# Patient Record
Sex: Male | Born: 1985 | Race: Black or African American | Hispanic: No | Marital: Single | Smoking: Former smoker
Health system: Southern US, Community
[De-identification: ages and names within clinical notes are randomized; demographics above are authoritative.]

## PROBLEM LIST (undated history)

## (undated) DIAGNOSIS — I214 Non-ST elevation (NSTEMI) myocardial infarction: Secondary | ICD-10-CM

## (undated) DIAGNOSIS — N289 Disorder of kidney and ureter, unspecified: Secondary | ICD-10-CM

## (undated) DIAGNOSIS — J45909 Unspecified asthma, uncomplicated: Secondary | ICD-10-CM

## (undated) DIAGNOSIS — N189 Chronic kidney disease, unspecified: Secondary | ICD-10-CM

## (undated) DIAGNOSIS — Z95810 Presence of automatic (implantable) cardiac defibrillator: Secondary | ICD-10-CM

## (undated) DIAGNOSIS — I10 Essential (primary) hypertension: Secondary | ICD-10-CM

## (undated) DIAGNOSIS — I480 Paroxysmal atrial fibrillation: Secondary | ICD-10-CM

## (undated) HISTORY — DX: Presence of automatic (implantable) cardiac defibrillator: Z95.810

## (undated) HISTORY — PX: NO PAST SURGERIES: SHX2092

## (undated) HISTORY — DX: Paroxysmal atrial fibrillation: I48.0

---

## 2017-03-13 ENCOUNTER — Inpatient Hospital Stay (HOSPITAL_COMMUNITY)
Admission: AD | Admit: 2017-03-13 | Discharge: 2017-03-17 | DRG: 280 | Disposition: A | Discharge status: Home / Self Care | Payer: Self-pay | Source: Other Acute Inpatient Hospital | Attending: Cardiovascular Disease | Admitting: Cardiovascular Disease

## 2017-03-13 ENCOUNTER — Encounter (HOSPITAL_COMMUNITY): Payer: Self-pay | Admitting: Student

## 2017-03-13 DIAGNOSIS — IMO0001 Reserved for inherently not codable concepts without codable children: Secondary | ICD-10-CM

## 2017-03-13 DIAGNOSIS — Z87891 Personal history of nicotine dependence: Secondary | ICD-10-CM

## 2017-03-13 DIAGNOSIS — I428 Other cardiomyopathies: Secondary | ICD-10-CM

## 2017-03-13 DIAGNOSIS — R0602 Shortness of breath: Secondary | ICD-10-CM

## 2017-03-13 DIAGNOSIS — I130 Hypertensive heart and chronic kidney disease with heart failure and stage 1 through stage 4 chronic kidney disease, or unspecified chronic kidney disease: Secondary | ICD-10-CM | POA: Diagnosis present

## 2017-03-13 DIAGNOSIS — I10 Essential (primary) hypertension: Secondary | ICD-10-CM | POA: Diagnosis present

## 2017-03-13 DIAGNOSIS — I5043 Acute on chronic combined systolic (congestive) and diastolic (congestive) heart failure: Secondary | ICD-10-CM | POA: Diagnosis present

## 2017-03-13 DIAGNOSIS — D72829 Elevated white blood cell count, unspecified: Secondary | ICD-10-CM | POA: Diagnosis present

## 2017-03-13 DIAGNOSIS — Z0389 Encounter for observation for other suspected diseases and conditions ruled out: Secondary | ICD-10-CM

## 2017-03-13 DIAGNOSIS — E785 Hyperlipidemia, unspecified: Secondary | ICD-10-CM | POA: Diagnosis present

## 2017-03-13 DIAGNOSIS — G4733 Obstructive sleep apnea (adult) (pediatric): Secondary | ICD-10-CM | POA: Diagnosis present

## 2017-03-13 DIAGNOSIS — Z6841 Body Mass Index (BMI) 40.0 and over, adult: Secondary | ICD-10-CM

## 2017-03-13 DIAGNOSIS — I493 Ventricular premature depolarization: Secondary | ICD-10-CM | POA: Diagnosis not present

## 2017-03-13 DIAGNOSIS — Z8249 Family history of ischemic heart disease and other diseases of the circulatory system: Secondary | ICD-10-CM

## 2017-03-13 DIAGNOSIS — I214 Non-ST elevation (NSTEMI) myocardial infarction: Principal | ICD-10-CM

## 2017-03-13 DIAGNOSIS — N182 Chronic kidney disease, stage 2 (mild): Secondary | ICD-10-CM | POA: Diagnosis present

## 2017-03-13 HISTORY — DX: Non-ST elevation (NSTEMI) myocardial infarction: I21.4

## 2017-03-13 HISTORY — DX: Chronic kidney disease, unspecified: N18.9

## 2017-03-13 HISTORY — DX: Essential (primary) hypertension: I10

## 2017-03-13 HISTORY — DX: Unspecified asthma, uncomplicated: J45.909

## 2017-03-13 HISTORY — DX: Disorder of kidney and ureter, unspecified: N28.9

## 2017-03-13 LAB — TROPONIN I: TROPONIN I: 12.74 ng/mL — AB (ref <0.03)

## 2017-03-13 LAB — HEMOGLOBIN A1C
HEMOGLOBIN A1C: 5.2 % (ref 4.8–5.6)
Mean Plasma Glucose: 102.54 mg/dL

## 2017-03-13 LAB — HEPARIN LEVEL (UNFRACTIONATED): Heparin Unfractionated: 0.6 IU/mL (ref 0.30–0.70)

## 2017-03-13 MED ORDER — ONDANSETRON HCL 4 MG/2ML IJ SOLN: 4.0000 mg | Freq: Four times a day (QID) | INTRAMUSCULAR | Status: DC | PRN

## 2017-03-13 MED ORDER — ACETAMINOPHEN 325 MG PO TABS
650.0000 mg | ORAL_TABLET | ORAL | Status: DC | PRN
  Administered 2017-03-14: 650 mg via ORAL
  Filled 2017-03-13: qty 2

## 2017-03-13 MED ORDER — SODIUM CHLORIDE 0.9 % IV SOLN
INTRAVENOUS | Status: DC
  Administered 2017-03-14: 07:00:00 via INTRAVENOUS

## 2017-03-13 MED ORDER — SODIUM CHLORIDE 0.9% FLUSH: 3.0000 mL | Freq: Two times a day (BID) | INTRAVENOUS | Status: DC

## 2017-03-13 MED ORDER — SODIUM CHLORIDE 0.9% FLUSH: 3.0000 mL | INTRAVENOUS | Status: DC | PRN

## 2017-03-13 MED ORDER — HEPARIN (PORCINE) IN NACL 100-0.45 UNIT/ML-% IJ SOLN
1800.0000 [IU]/h | INTRAMUSCULAR | Status: DC
  Administered 2017-03-13 – 2017-03-14 (×2): 1900 [IU]/h via INTRAVENOUS
  Filled 2017-03-13: qty 250

## 2017-03-13 MED ORDER — ASPIRIN 81 MG PO CHEW
81.0000 mg | CHEWABLE_TABLET | ORAL | Status: AC
  Administered 2017-03-14: 81 mg via ORAL
  Filled 2017-03-13: qty 1

## 2017-03-13 MED ORDER — METOPROLOL TARTRATE 12.5 MG HALF TABLET
12.5000 mg | ORAL_TABLET | Freq: Two times a day (BID) | ORAL | Status: DC
  Administered 2017-03-13: 12.5 mg via ORAL
  Filled 2017-03-13: qty 1

## 2017-03-13 MED ORDER — AMLODIPINE BESYLATE 10 MG PO TABS
10.0000 mg | ORAL_TABLET | Freq: Every day | ORAL | Status: DC
  Administered 2017-03-14: 10 mg via ORAL
  Filled 2017-03-13: qty 1

## 2017-03-13 MED ORDER — SODIUM CHLORIDE 0.9 % IV SOLN: 250.0000 mL | INTRAVENOUS | Status: DC | PRN

## 2017-03-13 MED ORDER — ASPIRIN EC 81 MG PO TBEC
81.0000 mg | DELAYED_RELEASE_TABLET | Freq: Every day | ORAL | Status: DC
  Administered 2017-03-15 – 2017-03-17 (×3): 81 mg via ORAL
  Filled 2017-03-13 (×3): qty 1

## 2017-03-13 MED ORDER — NITROGLYCERIN 0.4 MG SL SUBL: 0.4000 mg | SUBLINGUAL_TABLET | SUBLINGUAL | Status: DC | PRN

## 2017-03-13 MED ORDER — ATORVASTATIN CALCIUM 80 MG PO TABS
80.0000 mg | ORAL_TABLET | Freq: Every day | ORAL | Status: DC
  Administered 2017-03-14: 80 mg via ORAL
  Filled 2017-03-13: qty 1

## 2017-03-13 NOTE — Progress Notes (Signed)
ANTICOAGULATION CONSULT NOTE - Follow up Consult  Pharmacy Consult for Heparin Indication:  ACS/NSTEMI  No Known Allergies  Patient Measurements: Height: 5\' 9"  (175.3 cm) Weight: (!) 352 lb 6.4 oz (159.8 kg) (scale A) IBW/kg (Calculated) : 70.7 Heparin Dosing Weight: 110 kg  Vital Signs: Temp: 98.9 F (37.2 C) (08/29 2024) Temp Source: Oral (08/29 2024) BP: 162/97 (08/29 2024) Pulse Rate: 74 (08/29 2024)  Labs:  Recent Labs  03/13/17 1955 03/13/17 2143  HEPARINUNFRC  --  0.60  TROPONINI 12.74*  --     CrCl cannot be calculated (No order found.).   Medical History: Past Medical History:  Diagnosis Date  . Childhood asthma   . CKD (chronic kidney disease)    "left kidney weaker than right" (03/13/2017)  . Hypertension   . NSTEMI (non-ST elevated myocardial infarction) (HCC) 03/13/2017   [name] 03/13/2017  . Renal insufficiency    PTA medications: Amlodipine 10 mg po daily and Lisinopril 10 mg daily  PTA med list pending.   Medications:  Scheduled:  . [START ON 03/14/2017] amLODipine  10 mg Oral Daily  . [START ON 03/14/2017] aspirin  81 mg Oral Pre-Cath  . [START ON 03/14/2017] aspirin EC  81 mg Oral Daily  . [START ON 03/14/2017] atorvastatin  80 mg Oral q1800  . metoprolol tartrate  12.5 mg Oral BID  . sodium chloride flush  3 mL Intravenous Q12H    Assessment: 31 y.o obese male transferred from [facility] for an NSTEMI. Initial troponin 2.850 with repeat 9.020. PMH of HTN and renal insufficiency noted.  Heparin drip started at [facility] with 5000 unit IV bolus at 13:43 today and heparin drip at 12 units/kg/hr (1900 units/hr=19 ml/hr). RN reports heparin drip currently infusing at 19 ml/hr (1900 units/hr).  No bleeding reported.  --Heparin level came back therapeutic at 0.6 this evening, no bleeding noted--  Goal of Therapy:  Heparin level 0.3-0.7 units/ml Monitor platelets by anticoagulation protocol: Yes   Plan:  Continue IV Heparin drip at  1900 units/hr  Daily HL, CBC while on IV heparin drip.   Thank you for allowing [name] to participate in this patients care. Signe Colt, PharmD 03/13/2017 11:01 PM

## 2017-03-13 NOTE — Progress Notes (Signed)
ANTICOAGULATION CONSULT NOTE - Initial Consult  Pharmacy Consult for Heparin Indication:  ACS/NSTEMI  No Known Allergies  Patient Measurements: Height: 5\' 9"  (175.3 cm) Weight: (!) 352 lb 6.4 oz (159.8 kg) (scale A) IBW/kg (Calculated) : 70.7 Heparin Dosing Weight: 110 kg  Vital Signs: Temp: 98.9 F (37.2 C) (08/29 2024) Temp Source: Oral (08/29 2024) Pulse Rate: 74 (08/29 2024)  Labs: No results for input(s): HGB, HCT, PLT, APTT, LABPROT, INR, HEPARINUNFRC, HEPRLOWMOCWT, CREATININE, CKTOTAL, CKMB, TROPONINI in the last 72 hours.  CrCl cannot be calculated (No order found.).   Medical History: Past Medical History:  Diagnosis Date  . Hypertension   . Renal insufficiency    PTA medications: Amlodipine 10 mg po daily and Lisinopril 10 mg daily  PTA med list pending.   Medications:  Scheduled:  . [START ON 03/14/2017] amLODipine  10 mg Oral Daily  . [START ON 03/14/2017] aspirin  81 mg Oral Pre-Cath  . [START ON 03/14/2017] aspirin EC  81 mg Oral Daily  . [START ON 03/14/2017] atorvastatin  80 mg Oral q1800  . metoprolol tartrate  12.5 mg Oral BID  . sodium chloride flush  3 mL Intravenous Q12H    Assessment: 31 y.o obese male  transferred from [facility] for an NSTEMI. Initial troponin 2.850 with repeat 9.020. PMH of HTN and renal insufficiency noted.  Heparin drip started at  [facility] with 5000 unit IV bolus at 13:43 today and heparin drip at 12 units/kg/hr (1900 units/hr=19 ml/hr). RN reports heparin drip currently infusing at 19 ml/hr (1900 units/hr).  No bleeding reported.  I spoke with patient-confirmed he is not taking any anticoagulant PTA , except for ASA 81mg  daily (misses some days of ASA, though).  Educated patient about his IV heparin and to report any bleeding to RN.    Goal of Therapy:  Heparin level 0.3-0.7 units/ml Monitor platelets by anticoagulation protocol: Yes   Plan:  Continue IV Heparin drip 1900 units/hr  Heparin level  STAT Daily HL, CBC while on IV heparin drip.   Thank you for allowing pharmacy to be part of this patients care team. Noah Delaine, RPh Clinical Pharmacist Pager: [phone]-4P [phone] Main Pharmacy [phone] 03/13/2017,8:43 PM

## 2017-03-13 NOTE — Progress Notes (Addendum)
Patient has arrived from [facility]. Patient stable. No CP. Cardiology PA, Strader notfiied regarding patient's arrival.  Note placed by Westley Foots, RN

## 2017-03-13 NOTE — H&P (Signed)
History & Physical    Patient ID: Richard Cochran MRN: [id number], DOB/AGE: 08/17/1985   Admit date: 03/13/2017  Primary Physician: No primary care provider on file. Primary Cardiologist: New to [facility]   History of Present Illness    Richard Cochran is a 31 y.o. male with past medical history of HTN and renal insufficiency who presents to [facility] on 03/13/2017 as a transfer from [facility] for an NSTEMI.   The patient reports he developed sternal chest pressure along 0300 this morning which awoke him from sleep. He denies any associated dyspnea, nausea, vomiting, or diaphoresis. He initially thought the pain was secondary to gas and he took antiacids with no relief. He took his son to school then proceeded to the ED. While there, he was given SL NTG with complete resolution of this symptoms. He denies any recurrent symptoms since. No episodes of chest pain or dyspnea prior to today.   No prior history of CAD. No known HLD or Type 2 DM. No known family history of CAD. Reports smoking cigarettes in the past but quit in his early 20's. Reports occasional alcohol use but no more than 2 drinks per week. He denies any recreational drug use. 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   Initial troponin 2.850 with repeat 9.020. Other labs show Na+ 142, K+ 4.3, creatinine 1.30 (baseline 1.2 - 1.3). WBC 13.2, Hgb 14.7, and platelets 229. D-dimer 244 (normal < 230 per reference range). PT 11.8, INR 1.04.    Past Medical History    Past Medical History:  Diagnosis Date  . Hypertension   . Renal insufficiency     No past surgical history on file.   Allergies  Allergies not on file   Home Medications    Prior to Admission medications   Not on File    Family History    Family History  Problem Relation Age of Onset  . Hypertension Mother   . Hypertension Father     Social History    Social History   Social History  . Marital status: Single    Spouse name: N/A  . Number of  children: N/A  . Years of education: N/A   Occupational History  . Not on file.   Social History Main Topics  . Smoking status: Former Games developer  . Smokeless tobacco: Never Used  . Alcohol use 0.6 oz/week    1 Shots of liquor per week  . Drug use: No  . Sexual activity: Not on file   Other Topics Concern  . Not on file   Social History Narrative  . No narrative on file     Review of Systems    General:  No chills, fever, night sweats or weight changes.  Cardiovascular:  No dyspnea on exertion, edema, orthopnea, palpitations, paroxysmal nocturnal dyspnea. Positive for chest pain.  Dermatological: No rash, lesions/masses Respiratory: No cough, dyspnea Urologic: No hematuria, dysuria Abdominal:   No nausea, vomiting, diarrhea, bright red blood per rectum, melena, or hematemesis Neurologic:  No visual changes, wkns, changes in mental status. All other systems reviewed and are otherwise negative except as noted above.  Physical Exam    There were no vitals taken for this visit.  General: Well developed, obese African American male in no acute distress. Head: Normocephalic, atraumatic, sclera non-icteric, no xanthomas, nares are without discharge.  Neck: No carotid bruits. JVD not elevated.  Lungs: Respirations regular and unlabored, without wheezes or rales.  Heart: Regular rate and rhythm.  No S3 or S4.  No murmur, no rubs, or gallops appreciated. Abdomen: Soft, non-tender, non-distended with normoactive bowel sounds. No hepatomegaly. No rebound/guarding. No obvious abdominal masses. Msk:  Strength and tone appear normal for age. No joint deformities or effusions. Extremities: No clubbing or cyanosis. No lower extremity edema.  Distal pedal pulses are 2+ bilaterally. Neuro: Alert and oriented X 3. Moves all extremities spontaneously. No focal deficits noted. Psych:  Responds to questions appropriately with a normal affect. Skin: No rashes or lesions noted  Labs    Initial  troponin 2.850 with repeat 9.020. Na+ 142, K+ 4.3, creatinine 1.30 (baseline 1.2 - 1.3). WBC 13.2, Hgb 14.7, and platelets 229. D-dimer 244 (normal < 230 per reference range). PT 11.8, INR 1.04.  Radiology Studies    No results found.  EKG & Cardiac Imaging    EKG:  NSR, moderate LVH. No prior tracings available for comparison. - Personally Reviewed  ECHOCARDIOGRAM: None on File  Assessment & Plan    1. NSTEMI - the patient developed sternal chest pressure along 0300 this morning which awoke him from sleep. He denies any associated dyspnea, nausea, vomiting, or diaphoresis. Symptoms relieved with SL NTG.  - No prior history of CAD. No known HLD or Type 2 DM. No known family history of premature CAD.  - initial troponin 2.850 with repeat 9.020. Continue to cycle cardiac enzymes. Heparin per Pharmacy consult. Start BB and statin therapy. Check FLP and Hgb A1c.  - The patient understands that risks included but are not limited to stroke (1 in 1000), death (1 in 1000), kidney failure [usually temporary] (1 in 500), bleeding (1 in 200), allergic reaction [possibly serious] (1 in 200). Scheduled for cardiac catheterization tomorrow afternoon. Clear liquids for breakfast then NPO.   2. HTN - on Amlodipine 10mg  daily and Lisinopril 10mg  daily as an outpatient. Hold Lisinopril in anticipation of cardiac catheterization. Start BB therapy with current ACS.   3. Renal Insufficiency  - creatinine 1.30 (baseline 1.2 - 1.3). - follow closely following cardiac catheterization.   4. Elevated D-dimer - D-dimer 244 (normal < 230 per reference range). - if cath without acute findings, will need a CTA to rule-out PE.    Signed, Ellsworth Lennox, PA-C 03/13/2017, 8:07 PM Pager: [phone]  Personally seen and examined. Agree with above.  31 year old morbidly obese male, creat 1.3, trop 9, No ST changes on ECG with CP at 3am this AM, relieved with NTG. No fevers, no syncope, no SOB, no nausea.. No  early family history of MI. No drug use. Wife works with EP's at [employer].   RRR, obese, NAD, MAE, no rubs, Normal radial pulses, equal bilateral.   NSTEMI  - cath in AM  - IV heparin, Bb, amlodipine, holding ACE for cath and creat 1.3.   - If cath with no CAD, ?PE or myocarditis  - No distress currently  Donato Schultz, MD

## 2017-03-14 ENCOUNTER — Ambulatory Visit (HOSPITAL_COMMUNITY): Admit: 2017-03-14 | Payer: Self-pay | Admitting: Cardiology

## 2017-03-14 ENCOUNTER — Encounter (HOSPITAL_COMMUNITY): Payer: Self-pay | Admitting: Cardiology

## 2017-03-14 ENCOUNTER — Encounter (HOSPITAL_COMMUNITY)
Admission: AD | Disposition: A | Discharge status: Home / Self Care | Payer: Self-pay | Source: Other Acute Inpatient Hospital | Attending: Cardiovascular Disease

## 2017-03-14 ENCOUNTER — Inpatient Hospital Stay (HOSPITAL_COMMUNITY): Payer: Self-pay

## 2017-03-14 DIAGNOSIS — I428 Other cardiomyopathies: Secondary | ICD-10-CM

## 2017-03-14 DIAGNOSIS — I10 Essential (primary) hypertension: Secondary | ICD-10-CM

## 2017-03-14 DIAGNOSIS — R0683 Snoring: Secondary | ICD-10-CM

## 2017-03-14 HISTORY — PX: LEFT HEART CATH AND CORONARY ANGIOGRAPHY: CATH118249

## 2017-03-14 LAB — LIPID PANEL
Cholesterol: 175 mg/dL (ref 0–200)
HDL: 53 mg/dL (ref >40)
LDL CALC: 104 mg/dL — AB (ref 0–99)
Total CHOL/HDL Ratio: 3.3 RATIO
Triglycerides: 88 mg/dL (ref <150)
VLDL: 18 mg/dL (ref 0–40)

## 2017-03-14 LAB — ECHOCARDIOGRAM COMPLETE
Ao-asc: 34 cm
Area-P 1/2: 5.95 cm2
E decel time: 127 msec
FS: 6 % — AB (ref 28–44)
Height: 69 in
IVS/LV PW RATIO, ED: 0.71
LA ID, A-P, ES: 42 mm
LA diam index: 1.47 cm/m2
LA vol A4C: 92.6 ml
LA vol index: 34 mL/m2
LA vol: 97.3 mL
LDCA: 5.73 cm2
LEFT ATRIUM END SYS DIAM: 42 mm
LVOTD: 27 mm
MV Dec: 127
MV pk E vel: 57.5 m/s
MVPKAVEL: 28.2 m/s
P 1/2 time: 37 ms
PW: 20.8 mm — AB (ref 0.6–1.1)
RV TAPSE: 25 mm
Weight: 5577.6 oz

## 2017-03-14 LAB — CBC
HCT: 45.5 % (ref 39.0–52.0)
HEMOGLOBIN: 14.4 g/dL (ref 13.0–17.0)
MCH: 26.8 pg (ref 26.0–34.0)
MCHC: 31.6 g/dL (ref 30.0–36.0)
MCV: 84.7 fL (ref 78.0–100.0)
Platelets: 195 10*3/uL (ref 150–400)
RBC: 5.37 MIL/uL (ref 4.22–5.81)
RDW: 14 % (ref 11.5–15.5)
WBC: 13.6 10*3/uL — ABNORMAL HIGH (ref 4.0–10.5)

## 2017-03-14 LAB — BASIC METABOLIC PANEL
Anion gap: 10 (ref 5–15)
BUN: 11 mg/dL (ref 6–20)
CHLORIDE: 108 mmol/L (ref 101–111)
CO2: 22 mmol/L (ref 22–32)
CREATININE: 1.25 mg/dL — AB (ref 0.61–1.24)
Calcium: 8.9 mg/dL (ref 8.9–10.3)
GFR calc Af Amer: >60 mL/min (ref >60)
GFR calc non Af Amer: >60 mL/min (ref >60)
Glucose, Bld: 93 mg/dL (ref 65–99)
Potassium: 4 mmol/L (ref 3.5–5.1)
SODIUM: 140 mmol/L (ref 135–145)

## 2017-03-14 LAB — PROTIME-INR
INR: 1.05
PROTHROMBIN TIME: 13.6 s (ref 11.4–15.2)

## 2017-03-14 LAB — TROPONIN I
TROPONIN I: 14.77 ng/mL — AB (ref <0.03)
TROPONIN I: 8.92 ng/mL — AB (ref <0.03)

## 2017-03-14 LAB — HEPARIN LEVEL (UNFRACTIONATED)
HEPARIN UNFRACTIONATED: 0.73 [IU]/mL — AB (ref 0.30–0.70)
Heparin Unfractionated: 1.01 IU/mL — ABNORMAL HIGH (ref 0.30–0.70)

## 2017-03-14 SURGERY — LEFT HEART CATH AND CORONARY ANGIOGRAPHY
Anesthesia: LOCAL

## 2017-03-14 MED ORDER — LIDOCAINE HCL (PF) 1 % IJ SOLN
INTRAMUSCULAR | Status: DC | PRN
  Administered 2017-03-14: 2 mL

## 2017-03-14 MED ORDER — HEPARIN SODIUM (PORCINE) 5000 UNIT/ML IJ SOLN: 5000.0000 [IU] | Freq: Three times a day (TID) | INTRAMUSCULAR | Status: DC

## 2017-03-14 MED ORDER — IOPAMIDOL (ISOVUE-370) INJECTION 76%
INTRAVENOUS | Status: AC
  Filled 2017-03-14: qty 100

## 2017-03-14 MED ORDER — CARVEDILOL 6.25 MG PO TABS
6.2500 mg | ORAL_TABLET | Freq: Two times a day (BID) | ORAL | Status: DC
  Administered 2017-03-14 (×2): 6.25 mg via ORAL
  Filled 2017-03-14 (×2): qty 1

## 2017-03-14 MED ORDER — SODIUM CHLORIDE 0.9% FLUSH
3.0000 mL | INTRAVENOUS | Status: DC | PRN
  Administered 2017-03-15: 3 mL via INTRAVENOUS
  Filled 2017-03-14: qty 3

## 2017-03-14 MED ORDER — IOPAMIDOL (ISOVUE-370) INJECTION 76%
INTRAVENOUS | Status: DC | PRN
  Administered 2017-03-14: 110 mL via INTRA_ARTERIAL

## 2017-03-14 MED ORDER — SODIUM CHLORIDE 0.9 % IV SOLN: 250.0000 mL | INTRAVENOUS | Status: DC | PRN

## 2017-03-14 MED ORDER — SPIRONOLACTONE 25 MG PO TABS
12.5000 mg | ORAL_TABLET | Freq: Two times a day (BID) | ORAL | Status: DC
  Administered 2017-03-14 – 2017-03-16 (×5): 12.5 mg via ORAL
  Filled 2017-03-14 (×5): qty 1

## 2017-03-14 MED ORDER — FENTANYL CITRATE (PF) 100 MCG/2ML IJ SOLN
INTRAMUSCULAR | Status: DC | PRN
  Administered 2017-03-14 (×2): 25 ug via INTRAVENOUS

## 2017-03-14 MED ORDER — FENTANYL CITRATE (PF) 100 MCG/2ML IJ SOLN
INTRAMUSCULAR | Status: AC
  Filled 2017-03-14: qty 2

## 2017-03-14 MED ORDER — LISINOPRIL 5 MG PO TABS
5.0000 mg | ORAL_TABLET | Freq: Every day | ORAL | Status: DC
  Administered 2017-03-14: 5 mg via ORAL
  Filled 2017-03-14: qty 1

## 2017-03-14 MED ORDER — SODIUM CHLORIDE 0.9% FLUSH
3.0000 mL | Freq: Two times a day (BID) | INTRAVENOUS | Status: DC
  Administered 2017-03-14 – 2017-03-17 (×4): 3 mL via INTRAVENOUS

## 2017-03-14 MED ORDER — HEPARIN SODIUM (PORCINE) 1000 UNIT/ML IJ SOLN
INTRAMUSCULAR | Status: AC
  Filled 2017-03-14: qty 1

## 2017-03-14 MED ORDER — AMLODIPINE BESYLATE 5 MG PO TABS: 5.0000 mg | ORAL_TABLET | Freq: Every day | ORAL | Status: DC

## 2017-03-14 MED ORDER — MIDAZOLAM HCL 2 MG/2ML IJ SOLN
INTRAMUSCULAR | Status: DC | PRN
  Administered 2017-03-14 (×2): 1 mg via INTRAVENOUS

## 2017-03-14 MED ORDER — FUROSEMIDE 10 MG/ML IJ SOLN
40.0000 mg | Freq: Two times a day (BID) | INTRAMUSCULAR | Status: DC
  Administered 2017-03-14 – 2017-03-15 (×3): 40 mg via INTRAVENOUS
  Filled 2017-03-14 (×3): qty 4

## 2017-03-14 MED ORDER — HEPARIN SODIUM (PORCINE) 1000 UNIT/ML IJ SOLN
INTRAMUSCULAR | Status: DC | PRN
  Administered 2017-03-14: 6000 [IU] via INTRAVENOUS

## 2017-03-14 MED ORDER — LIDOCAINE HCL (PF) 1 % IJ SOLN
INTRAMUSCULAR | Status: AC
  Filled 2017-03-14: qty 30

## 2017-03-14 MED ORDER — VERAPAMIL HCL 2.5 MG/ML IV SOLN
INTRAVENOUS | Status: DC | PRN
  Administered 2017-03-14: 10 mL via INTRA_ARTERIAL

## 2017-03-14 MED ORDER — HEPARIN (PORCINE) IN NACL 2-0.9 UNIT/ML-% IJ SOLN
INTRAMUSCULAR | Status: AC | PRN
  Administered 2017-03-14: 1000 mL

## 2017-03-14 MED ORDER — MIDAZOLAM HCL 2 MG/2ML IJ SOLN
INTRAMUSCULAR | Status: AC
  Filled 2017-03-14: qty 2

## 2017-03-14 MED ORDER — VERAPAMIL HCL 2.5 MG/ML IV SOLN
INTRAVENOUS | Status: AC
  Filled 2017-03-14: qty 2

## 2017-03-14 MED ORDER — HEPARIN (PORCINE) IN NACL 100-0.45 UNIT/ML-% IJ SOLN
1600.0000 [IU]/h | INTRAMUSCULAR | Status: DC
  Administered 2017-03-14 – 2017-03-16 (×2): 1800 [IU]/h via INTRAVENOUS
  Filled 2017-03-14 (×3): qty 250

## 2017-03-14 MED ORDER — HEPARIN (PORCINE) IN NACL 2-0.9 UNIT/ML-% IJ SOLN
INTRAMUSCULAR | Status: AC
  Filled 2017-03-14: qty 1000

## 2017-03-14 SURGICAL SUPPLY — 15 items
CATH 5FR JL3.5 JR4 ANG PIG MP (CATHETERS) ×2 IMPLANT
CATH LAUNCHER 5F JL3 (CATHETERS) ×1 IMPLANT
CATH LAUNCHER 5F RADL (CATHETERS) ×1 IMPLANT
CATH OPTITORQUE JACKY 4.0 5F (CATHETERS) ×2 IMPLANT
CATHETER LAUNCHER 5F JL3 (CATHETERS) ×2
CATHETER LAUNCHER 5F RADL (CATHETERS) ×2
DEVICE RAD COMP TR BAND LRG (VASCULAR PRODUCTS) ×2 IMPLANT
GLIDESHEATH SLEND SS 6F .021 (SHEATH) ×2 IMPLANT
GUIDEWIRE INQWIRE 1.5J.035X260 (WIRE) ×1 IMPLANT
INQWIRE 1.5J .035X260CM (WIRE) ×2
KIT HEART LEFT (KITS) ×2 IMPLANT
PACK CARDIAC CATHETERIZATION (CUSTOM PROCEDURE TRAY) ×2 IMPLANT
SYR MEDRAD MARK V 150ML (SYRINGE) ×2 IMPLANT
TRANSDUCER W/STOPCOCK (MISCELLANEOUS) ×2 IMPLANT
TUBING CIL FLEX 10 FLL-RA (TUBING) ×2 IMPLANT

## 2017-03-14 NOTE — Progress Notes (Signed)
ANTICOAGULATION CONSULT NOTE - Follow Up Consult  Pharmacy Consult for heparin Indication: NSTEMI  Labs:  Recent Labs  03/13/17 1955 03/13/17 2143 03/14/17 0214  HEPARINUNFRC  --  0.60 0.73*  TROPONINI 12.74*  --   --     Assessment: 31yo male now slightly above goal on heparin after one level at goal.  Goal of Therapy:  Heparin level 0.3-0.7 units/ml   Plan:  Will decrease heparin gtt slightly to 1800 units/hr and check level with next lab draw.  Vernard Gambles, PharmD, BCPS  03/14/2017,3:07 AM

## 2017-03-14 NOTE — Progress Notes (Addendum)
Progress Note  Patient Name: Richard Cochran Date of Encounter: 03/14/2017  Primary Cardiologist: new  Subjective   No chest pain or SOB; back from cath lab  Inpatient Medications    Scheduled Meds: . amLODipine  10 mg Oral Daily  . aspirin EC  81 mg Oral Daily  . atorvastatin  80 mg Oral q1800  . carvedilol  6.25 mg Oral BID WC  . furosemide  40 mg Intravenous BID  . heparin  5,000 Units Subcutaneous Q8H  . lisinopril  5 mg Oral Daily  . sodium chloride flush  3 mL Intravenous Q12H   Continuous Infusions: . sodium chloride     PRN Meds: sodium chloride, acetaminophen, ondansetron (ZOFRAN) IV, sodium chloride flush   Vital Signs    Vitals:   03/14/17 1230 03/14/17 1300 03/14/17 1330 03/14/17 1400  BP: (!) 145/93 (!) 151/88 (!) 157/100 (!) 141/79  Pulse: 75 76 90 99  Resp:      Temp:    98.7 F (37.1 C)  TempSrc:    Oral  SpO2:    97%  Weight:      Height:        Intake/Output Summary (Last 24 hours) at 03/14/17 1416 Last data filed at 03/14/17 1313  Gross per 24 hour  Intake              360 ml  Output             2945 ml  Net            -2585 ml    I/O since admission:   [facility] Weights   03/13/17 2024 03/14/17 0541  Weight: (!) 352 lb 6.4 oz (159.8 kg) (!) 348 lb 9.6 oz (158.1 kg)    Telemetry    Sinus rhythm in 70 - 80s - Personally Reviewed  ECG    ECG (independently read by me):  Physical Exam   BP (!) 141/79 (BP Location: Left Arm)   Pulse 99   Temp 98.7 F (37.1 C) (Oral)   Resp 19   Ht 5\' 9"  (1.753 m)   Wt (!) 348 lb 9.6 oz (158.1 kg)   SpO2 97%   BMI 51.48 kg/m  General: Alert, oriented, no distress; obese  Skin: normal turgor, no rashes, warm and dry HEENT: Normocephalic, atraumatic. Pupils equal round and reactive to light; sclera anicteric; extraocular muscles intact;  Nose without nasal septal hypertrophy Mouth/Parynx benign; Mallinpatti scale 3 Neck:  Thick neck; No JVD, no carotid bruits; normal carotid  upstroke Lungs: clear to ausculatation and percussion; no wheezing or rales Chest wall: without tenderness to palpitation Heart: PMI not displaced, RRR, s1 s2 normal, 1/6 systolic murmur, no diastolic murmur, no rubs, gallops, thrills, or heaves Abdomen: soft, nontender; no hepatosplenomehaly, BS+; abdominal aorta nontender and not dilated by palpation. Back: no CVA tenderness Pulses 2+; R radial site stable Musculoskeletal: full range of motion, normal strength, no joint deformities Extremities: no clubbing cyanosis or edema, Homan's sign negative  Neurologic: grossly nonfocal; Cranial nerves grossly wnl Psychologic: Normal mood and affect   Labs    Chemistry Recent Labs Lab 03/14/17 0734  NA 140  K 4.0  CL 108  CO2 22  GLUCOSE 93  BUN 11  CREATININE 1.25*  CALCIUM 8.9  GFRNONAA >60  GFRAA >60  ANIONGAP 10     Hematology Recent Labs Lab 03/14/17 0734  WBC 13.6*  RBC 5.37  HGB 14.4  HCT 45.5  MCV 84.7  MCH  26.8  MCHC 31.6  RDW 14.0  PLT 195    Cardiac Enzymes Recent Labs Lab 03/13/17 1955 03/14/17 0214 03/14/17 0734  TROPONINI 12.74* 14.77* 8.92*   No results for input(s): TROPIPOC in the last 168 hours.   BNPNo results for input(s): BNP, PROBNP in the last 168 hours.   DDimer No results for input(s): DDIMER in the last 168 hours.   Lipid Panel     Component Value Date/Time   CHOL 175 03/14/2017 0214   TRIG 88 03/14/2017 0214   HDL 53 03/14/2017 0214   CHOLHDL 3.3 03/14/2017 0214   VLDL 18 03/14/2017 0214   LDLCALC 104 (H) 03/14/2017 0214    Radiology    No results found.  Cardiac Studies   Cardiac Cath Conclusion     There is severe left ventricular systolic dysfunction.  LV end diastolic pressure is moderately elevated.  The left ventricular ejection fraction is less than 25% by visual estimate.   1. Normal coronary anatomy 2. Marked LV enlargement with severe global hypokinesis and overall EF 15-20%. 3. Moderately elevated  LVEDP  Plan: aggressive therapy for CHF.     Patient Profile     31 y.o. male who was awakened with new onset chest pain which resolved with NTG sl, positive troponins, sent from [facility] for further evaluation.   Assessment & Plan    1.   NICM: EF 15 - 20% at cath with increased LVEDP; normal coronaries.  No recent change in exercise tolerance. No SOB.  Awakened from sleep 8/29 am with new chest pain. No recurrent chest pain since resolution. ? Myocarditis or other process;  Schedule for echo. Will plan to schedule for CT or cardiac MRI. Pk troponin 14.77.  With markedly decreased EF will resume heparin initially 8 hr post cath.   2. HTN: will treat with coreg,  lisinopril, decrease amlodipine and start spironolactone 12.5 mg bid, and titrate as BP allows.  3. Morbid obesity  4. Evaluate for OSA with h/o snoring, body habitus and awakening with chest pain. Will schedule for sleep study as outpatient.    Signed, Lennette Bihari, MD, [facility] 03/14/2017, 2:16 PM

## 2017-03-14 NOTE — Progress Notes (Signed)
I offered bath to pt and pt stated that he did it earlier this morning

## 2017-03-14 NOTE — Progress Notes (Signed)
ANTICOAGULATION CONSULT NOTE - Follow up Consult  Pharmacy Consult for Heparin Indication:  ACS/NSTEMI  No Known Allergies  Patient Measurements: Height: 5\' 9"  (175.3 cm) Weight: (!) 348 lb 9.6 oz (158.1 kg) IBW/kg (Calculated) : 70.7 Heparin Dosing Weight: 110 kg  Vital Signs: Temp: 98.7 F (37.1 C) (08/30 1400) Temp Source: Oral (08/30 1400) BP: 141/79 (08/30 1400) Pulse Rate: 99 (08/30 1400)  Labs:  Recent Labs  03/13/17 1955 03/13/17 2143 03/14/17 0214 03/14/17 0734  HGB  --   --   --  14.4  HCT  --   --   --  45.5  PLT  --   --   --  195  LABPROT  --   --   --  13.6  INR  --   --   --  1.05  HEPARINUNFRC  --  0.60 0.73* 1.01*  CREATININE  --   --   --  1.25*  TROPONINI 12.74*  --  14.77* 8.92*    Estimated Creatinine Clearance: 126.8 mL/min (A) (by C-G formula based on SCr of 1.25 mg/dL (H)).   Medical History: Past Medical History:  Diagnosis Date  . Childhood asthma   . CKD (chronic kidney disease)    "left kidney weaker than right" (03/13/2017)  . Hypertension   . NSTEMI (non-ST elevated myocardial infarction) (HCC) 03/13/2017   [name] 03/13/2017  . Renal insufficiency    PTA medications: Amlodipine 10 mg po daily and Lisinopril 10 mg daily  PTA med list pending.   Medications:  Scheduled:  . [START ON 03/15/2017] amLODipine  5 mg Oral Daily  . aspirin EC  81 mg Oral Daily  . atorvastatin  80 mg Oral q1800  . carvedilol  6.25 mg Oral BID WC  . furosemide  40 mg Intravenous BID  . heparin  5,000 Units Subcutaneous Q8H  . lisinopril  5 mg Oral Daily  . sodium chloride flush  3 mL Intravenous Q12H  . spironolactone  12.5 mg Oral BID    Assessment: 31 y.o obese male transferred from [facility] for an NSTEMI. Initial troponin 2.850 with repeat 9.020. PMH of HTN and renal insufficiency noted.  Heparin drip started at [facility]. Heparin was held for L. heart cath.  Heparin to be restarted 8 hours post TR band removal. Per RN, band was  removed at 1445.  Goal of Therapy:  Heparin level 0.3-0.7 units/ml Monitor platelets by anticoagulation protocol: Yes   Plan:  Restart IV Heparin drip at 1800 units/hr (starting at 2300) Follow up morning level Daily HL, CBC while on IV heparin drip.   Thank you for allowing [name] to participate in this patients care. Signe Colt, PharmD 03/14/2017 3:05 PM

## 2017-03-14 NOTE — Interval H&P Note (Signed)
History and Physical Interval Note:  03/14/2017 10:15 AM  Richard Cochran  has presented today for surgery, with the diagnosis of cp  The various methods of treatment have been discussed with the patient and family. After consideration of risks, benefits and other options for treatment, the patient has consented to  Procedure(s): LEFT HEART CATH AND CORONARY ANGIOGRAPHY (N/A) as a surgical intervention .  The patient's history has been reviewed, patient examined, no change in status, stable for surgery.  I have reviewed the patient's chart and labs.  Questions were answered to the patient's satisfaction.   Cath Lab Visit (complete for each Cath Lab visit)  Clinical Evaluation Leading to the Procedure:   ACS: Yes.    Non-ACS:    Anginal Classification: CCS IV  Anti-ischemic medical therapy: No Therapy  Non-Invasive Test Results: No non-invasive testing performed  Prior CABG: No previous CABG       Theron Arista [facility] 03/14/2017 10:15 AM

## 2017-03-14 NOTE — Progress Notes (Signed)
  Echocardiogram 2D Echocardiogram has been performed.  Amenah Tucci G Eniola Cerullo 03/14/2017, 1:26 PM

## 2017-03-14 NOTE — Progress Notes (Signed)
Lab reported Troponin of 12.74. Heparin drip infusing. Pt asymptomatic. MD paged. Will continue to monitor.

## 2017-03-15 ENCOUNTER — Other Ambulatory Visit: Payer: Self-pay | Admitting: *Deleted

## 2017-03-15 ENCOUNTER — Inpatient Hospital Stay (HOSPITAL_COMMUNITY): Payer: Self-pay

## 2017-03-15 DIAGNOSIS — I5043 Acute on chronic combined systolic (congestive) and diastolic (congestive) heart failure: Secondary | ICD-10-CM

## 2017-03-15 DIAGNOSIS — I10 Essential (primary) hypertension: Secondary | ICD-10-CM

## 2017-03-15 DIAGNOSIS — I428 Other cardiomyopathies: Secondary | ICD-10-CM

## 2017-03-15 DIAGNOSIS — D72829 Elevated white blood cell count, unspecified: Secondary | ICD-10-CM | POA: Diagnosis present

## 2017-03-15 DIAGNOSIS — E785 Hyperlipidemia, unspecified: Secondary | ICD-10-CM | POA: Diagnosis present

## 2017-03-15 DIAGNOSIS — I214 Non-ST elevation (NSTEMI) myocardial infarction: Secondary | ICD-10-CM

## 2017-03-15 DIAGNOSIS — N182 Chronic kidney disease, stage 2 (mild): Secondary | ICD-10-CM | POA: Diagnosis present

## 2017-03-15 DIAGNOSIS — R0681 Apnea, not elsewhere classified: Secondary | ICD-10-CM

## 2017-03-15 LAB — CBC
HEMATOCRIT: 47.1 % (ref 39.0–52.0)
HEMOGLOBIN: 15.2 g/dL (ref 13.0–17.0)
MCH: 27.2 pg (ref 26.0–34.0)
MCHC: 32.3 g/dL (ref 30.0–36.0)
MCV: 84.4 fL (ref 78.0–100.0)
Platelets: 210 10*3/uL (ref 150–400)
RBC: 5.58 MIL/uL (ref 4.22–5.81)
RDW: 14 % (ref 11.5–15.5)
WBC: 15 10*3/uL — ABNORMAL HIGH (ref 4.0–10.5)

## 2017-03-15 LAB — BASIC METABOLIC PANEL
Anion gap: 11 (ref 5–15)
BUN: 14 mg/dL (ref 6–20)
CALCIUM: 9.4 mg/dL (ref 8.9–10.3)
CO2: 24 mmol/L (ref 22–32)
Chloride: 103 mmol/L (ref 101–111)
Creatinine, Ser: 1.45 mg/dL — ABNORMAL HIGH (ref 0.61–1.24)
GFR calc Af Amer: >60 mL/min (ref >60)
GLUCOSE: 103 mg/dL — AB (ref 65–99)
POTASSIUM: 3.7 mmol/L (ref 3.5–5.1)
Sodium: 138 mmol/L (ref 135–145)

## 2017-03-15 LAB — HEPARIN LEVEL (UNFRACTIONATED): Heparin Unfractionated: 0.52 IU/mL (ref 0.30–0.70)

## 2017-03-15 LAB — HIV ANTIBODY (ROUTINE TESTING W REFLEX): HIV Screen 4th Generation wRfx: NONREACTIVE

## 2017-03-15 LAB — TSH: TSH: 2.034 u[IU]/mL (ref 0.350–4.500)

## 2017-03-15 MED ORDER — CARVEDILOL 6.25 MG PO TABS
6.2500 mg | ORAL_TABLET | Freq: Two times a day (BID) | ORAL | Status: DC
  Administered 2017-03-15 – 2017-03-16 (×3): 6.25 mg via ORAL
  Filled 2017-03-15 (×3): qty 1

## 2017-03-15 MED ORDER — TECHNETIUM TO 99M ALBUMIN AGGREGATED
4.0000 | Freq: Once | INTRAVENOUS | Status: AC | PRN
  Administered 2017-03-15: 4 via INTRAVENOUS

## 2017-03-15 MED ORDER — ATORVASTATIN CALCIUM 20 MG PO TABS
20.0000 mg | ORAL_TABLET | Freq: Every day | ORAL | Status: DC
  Administered 2017-03-15 – 2017-03-17 (×3): 20 mg via ORAL
  Filled 2017-03-15 (×3): qty 1

## 2017-03-15 MED ORDER — LOSARTAN POTASSIUM 25 MG PO TABS
25.0000 mg | ORAL_TABLET | Freq: Every day | ORAL | Status: DC
  Administered 2017-03-15 – 2017-03-16 (×2): 25 mg via ORAL
  Filled 2017-03-15 (×2): qty 1

## 2017-03-15 MED ORDER — LIVING BETTER WITH HEART FAILURE BOOK
Freq: Once | Status: AC
  Administered 2017-03-15: 09:00:00

## 2017-03-15 MED ORDER — TECHNETIUM TC 99M DIETHYLENETRIAME-PENTAACETIC ACID
30.0000 | Freq: Once | INTRAVENOUS | Status: AC | PRN
  Administered 2017-03-15: 30 via INTRAVENOUS

## 2017-03-15 NOTE — Progress Notes (Signed)
Continuous pulse ox ordered by unit Diplomatic Services operational officer. RN aware and she will placed patient on when it arrives to floor. RT discussed with patient regarding the machine and purpose for use.

## 2017-03-15 NOTE — Progress Notes (Signed)
   03/15/17 1000  Clinical Encounter Type  Visited With Patient  Visit Type Follow-up  Referral From Family  Consult/Referral To None  Spiritual Encounters  Spiritual Needs Other (Comment)  Stress Factors  Patient Stress Factors Other (Comment)  Family Stress Factors None identified  Advance Directives (For Healthcare)  Does Patient Have a Medical Advance Directive? No (Reviewed POA with patient)  Would patient like information on creating a medical advance directive? (The patient has an advanced directive filled out and is awai)  Mental Health Advance Directives  Does Patient Have a Mental Health Advance Directive? No    Visited with Richard Cochran who was alert and responsive.  The customer has a concern about what time the advanced directives can be completed.  Chaplain indicated to patient that prior to 2:30 is the best time.  Patient is waiting on the family to arrive so that spiritual care can be notified and notary will be available for witnesses.

## 2017-03-15 NOTE — Progress Notes (Signed)
Progress Note  Patient Name: Richard Cochran Date of Encounter: 03/15/2017  Primary Cardiologist: New to Dr. [name] this admission  Subjective   Feeling much better, wants to know when he can go home. Denies any further CP or SOB. No LEE. His girlfriend strongly feels he has OSA - she videotaped him overnight and he had evidence of apnea.  Inpatient Medications    Scheduled Meds: . amLODipine  5 mg Oral Daily  . aspirin EC  81 mg Oral Daily  . atorvastatin  80 mg Oral q1800  . carvedilol  6.25 mg Oral BID WC  . furosemide  40 mg Intravenous BID  . lisinopril  5 mg Oral Daily  . sodium chloride flush  3 mL Intravenous Q12H  . spironolactone  12.5 mg Oral BID   Continuous Infusions: . sodium chloride    . heparin 1,800 Units/hr (03/14/17 2317)   PRN Meds: sodium chloride, acetaminophen, ondansetron (ZOFRAN) IV, sodium chloride flush   Vital Signs    Vitals:   03/14/17 1400 03/14/17 1944 03/15/17 0019 03/15/17 0605  BP: (!) 141/79 136/79 114/70 133/73  Pulse: 99 97 95 85  Resp:  18 18 18   Temp: 98.7 F (37.1 C) 98.2 F (36.8 C) 98.4 F (36.9 C) 98 F (36.7 C)  TempSrc: Oral Oral Oral Oral  SpO2: 97% 98% 97% 97%  Weight:    (!) 340 lb 4.8 oz (154.4 kg)  Height:        Intake/Output Summary (Last 24 hours) at 03/15/17 1610 Last data filed at 03/15/17 9604  Gross per 24 hour  Intake            626.9 ml  Output             3270 ml  Net          -2643.1 ml   Filed Weights   03/13/17 2024 03/14/17 0541 03/15/17 0605  Weight: (!) 352 lb 6.4 oz (159.8 kg) (!) 348 lb 9.6 oz (158.1 kg) (!) 340 lb 4.8 oz (154.4 kg)    Telemetry    NSR/occ sinus tach- Personally Reviewed  Physical Exam   GEN: No acute distress, morbidly obese smiling AAM HEENT: Normocephalic, atraumatic, sclera non-icteric. Neck: No JVD or bruits. Cardiac: RRR no murmurs, rubs, or gallops.  Radials/DP/PT 1+ and equal bilaterally.  Respiratory: Diminished BS left base, no rales or rhonchi.  Breathing is unlabored. GI: Soft, nontender, non-distended, BS +x 4. MS: no deformity. Extremities: No clubbing or cyanosis. No edema. Distal pedal pulses are 2+ and equal bilaterally. Neuro:  AAOx3. Follows commands. Psych:  Responds to questions appropriately with a normal affect.  Labs    Chemistry Recent Labs Lab 03/14/17 0734 03/15/17 0808  NA 140 138  K 4.0 3.7  CL 108 103  CO2 22 24  GLUCOSE 93 103*  BUN 11 14  CREATININE 1.25* 1.45*  CALCIUM 8.9 9.4  GFRNONAA >60 >60  GFRAA >60 >60  ANIONGAP 10 11     Hematology Recent Labs Lab 03/14/17 0734 03/15/17 0808  WBC 13.6* 15.0*  RBC 5.37 5.58  HGB 14.4 15.2  HCT 45.5 47.1  MCV 84.7 84.4  MCH 26.8 27.2  MCHC 31.6 32.3  RDW 14.0 14.0  PLT 195 210    Cardiac Enzymes Recent Labs Lab 03/13/17 1955 03/14/17 0214 03/14/17 0734  TROPONINI 12.74* 14.77* 8.92*   No results for input(s): TROPIPOC in the last 168 hours.    Radiology    No results found.  Cardiac Studies   Cardiac Cath Conclusion    There is severe left ventricular systolic dysfunction.  LV end diastolic pressure is moderately elevated.  The left ventricular ejection fraction is less than 25% by visual estimate.  1. Normal coronary anatomy 2. Marked LV enlargement with severe global hypokinesis and overall EF 15-20%. 3. Moderately elevated LVEDP  Plan: aggressive therapy for CHF.   2D echo 03/14/17 Study Conclusions  - Left ventricle: The cavity size was severely dilated. There was   severe concentric hypertrophy. Systolic function was normal. The   estimated ejection fraction was in the range of 10% to 15%.   Diffuse hypokinesis. Doppler parameters are consistent with   abnormal left ventricular relaxation (grade 1 diastolic   dysfunction). - Aortic valve: Transvalvular velocity was within the normal range.   There was no stenosis. There was no regurgitation. - Mitral valve: Transvalvular velocity was within the normal  range.   There was no evidence for stenosis. There was trivial   regurgitation. - Left atrium: The atrium was moderately dilated. - Right ventricle: The cavity size was normal. Wall thickness was   normal. Systolic function was normal. - Atrial septum: No defect or patent foramen ovale was identified. - Tricuspid valve: There was no regurgitation.  Patient Profile     31 y.o. male with HTN, morbid obesity, former tobacco abuse, mild CKD II who presented to [facility] with new onset CP awakening him from sleep and troponin of 9 at OSH. Cardiac cath 03/14/17 with normal coronaries, EF 15-20%, moderately elevated LVEDP. 2D Echo shows EF 10-15%, grade diastolic dysfunction, moderate LAE.  Assessment & Plan    1. Elevated troponin and new onset combined CHF with severe NICM - etiology unclear, no preceding viral illness. Question severe OSA causing hypoxia leading to demand ischemia (although higher than I would expect if this were the case). D-dimer was mildly elevated at OSH. Do not suspect PE would cause his cardiomyopathy but team remains concerned that it could have precipitated his marked troponin elevation. D/w Dr. Tresa Endo, will obtain VQ scan to r/o PE and continue heparin per pharmacy until this is ruled out. Cardiac MRI has also been ordered to assess for infiltrative disease. I spoke with Dr. Shirlee Latch who would be reading this study and put in the comments for radiology to call him when study is complete - he agrees that they should see the patient as outpatient. He diuresed well on IV Lasix and is asymptomatic today. Weight is down from 352->340lb. Mild creatinine bump noted but still within spectrum of prior creatinines. Will hold further IV Lasix. He just started spironolactone last night so would follow renal function through today. He would likely benefit from [facility] as OP but need to figure out assistance options first (Medicaid potential). Will change lisinopril to losartan for easier transition as  outpatient. F/u BMET in AM. Care management consult. Plan dietician consult today for new onset CHF and rx Living Better With CHF book. Discussed 2g sodium diet, 2L fluid restriction with patient. Continue Coreg. D/c amlodipine.  2. HTN - follow BP with above changes.  3. Morbid obesity with suspected OSA - will need sleep study as OP. The wait list can be 2-3 months. Will perform overnight oximetry tonight to see if he would at least qualify for [facility] QHS overnight in the interim. Discussed life threatening nature of his obesity and advised lifestyle modification.  4. Mild hyperlipidemia - given no sig CAD on cath, will decrease statin to 20mg   daily. If the patient is tolerating statin at time of follow-up appointment, would consider rechecking liver function/lipid panel in 6-8 weeks.  5. CKD stage II - prior OP creatinines have run 1.2-1.4. Monitor with med changes post-cath.  6. Leukocytosis - per review of care everywhere, has had chronic elevation of this in the past in the 11 range. It is elevated today. Consider stress demargination. Will need f/u as OP.  Signed, Laurann Montana, PA-C  03/15/2017, 9:07 AM    Patient seen and examined. Agree with assessment and plan. Feels well. I/O -3-38; lost 12 lbs since admission. On carvedilol; to start ARB/spironolactone today with plans to transition to entresto. For cardiac MRI to assess for myocarditis or infiltrative process. Will obtain V/Q scan. May need life-vest at DC while titrating meds to see if LV improves. Will arrange for outpatient sleep study.    Lennette Bihari, MD, [facility] 03/15/2017 10:46 AM

## 2017-03-15 NOTE — Progress Notes (Signed)
Nutrition Education Note  RD consulted for nutrition education regarding new onset CHF.  RD provided "Heart Failure Nutrition Therapy" handout from the Academy of Nutrition and Dietetics.   Reviewed patient's dietary recall. Pt typically consumes 2 meals/d PTA, lunch time and a late dinner r/t work schedule. Pt consumes an abundance of fried foods. Chicken tenders, french fries and fried chicken. Pt states he enjoys spaghetti, noodle dishes and rice.   Provided examples on ways to decrease sodium intake in diet. Discouraged intake of processed foods. Pt reports not using a salt shaker while eating. Encouraged fresh fruits and vegetables as well as whole grain sources of carbohydrates to maximize fiber intake.   RD discussed why it is important for patient to adhere to diet recommendations, and emphasized the role of fluids, foods to avoid, and importance of weighing self daily. Pt agreeable to weighing himself daily. Teach back method used.  Pt reports good PO intake.   Expect fair compliance. Pt inquired about meal options while working that are diet appropriate. Pt reports already preparing meals with peppers and onions, not additional salt. RD discussed additional diet appropriate seasonings/flavorings.   Body mass index is 50.25 kg/m. Pt meets criteria for morbid obesity based on current BMI.  Current diet order is heart healthy/carbohydrate modified, patient is consuming approximately 100% of meals at this time.   Labs and medications reviewed.   No further nutrition interventions warranted at this time. RD contact information provided. If additional nutrition issues arise, please re-consult RD.   Fransisca Kaufmann, MS, RDN, LDN 03/15/2017 11:10 AM

## 2017-03-15 NOTE — Progress Notes (Signed)
   03/15/17 1400  Clinical Encounter Type  Visited With Patient and family together  Visit Type Other (Comment) (completion of HCPOA)  Referral From Nurse  Consult/Referral To None  Spiritual Encounters  Spiritual Needs Literature (Patient needed a signed HCPOA, it was executed by Amy along )  Stress Factors  Patient Stress Factors Health changes  Family Stress Factors Health changes (stress factors although not at a high level were a concern. )  Advance Directives (For Healthcare)  Does Patient Have a Medical Advance Directive? Yes  Does patient want to make changes to medical advance directive? No - Patient declined  Type of Advance Directive Healthcare Power of Attorney  Copy of Healthcare Power of Attorney in Chart? Yes  Mental Health Advance Directives  Does Patient Have a Mental Health Advance Directive? No

## 2017-03-15 NOTE — Progress Notes (Signed)
ANTICOAGULATION CONSULT NOTE - Follow Up Consult  Pharmacy Consult for Heparin Indication: ACS/NSTEMI  No Known Allergies  Patient Measurements: Height: 5\' 9"  (175.3 cm) Weight: (!) 340 lb 4.8 oz (154.4 kg) IBW/kg (Calculated) : 70.7 Heparin Dosing Weight: 110 kg  Vital Signs: Temp: 98 F (36.7 C) (08/31 0605) Temp Source: Oral (08/31 0605) BP: 124/79 (08/31 1135) Pulse Rate: 83 (08/31 1135)  Labs:  Recent Labs  03/13/17 1955  03/14/17 0214 03/14/17 0734 03/15/17 0808  HGB  --   --   --  14.4 15.2  HCT  --   --   --  45.5 47.1  PLT  --   --   --  195 210  LABPROT  --   --   --  13.6  --   INR  --   --   --  1.05  --   HEPARINUNFRC  --   < > 0.73* 1.01* 0.52  CREATININE  --   --   --  1.25* 1.45*  TROPONINI 12.74*  --  14.77* 8.92*  --   < > = values in this interval not displayed.  Estimated Creatinine Clearance: 107.8 mL/min (A) (by C-G formula based on SCr of 1.45 mg/dL (H)).  Assessment:  31 y.o male transferred from [facility] for NSTEMI. Heparin begun while and [facility] and continued at [facility]. Held for cath 8/30, then resumed 8 hrs post-TR band removal.    Heparin level is therapeutic (0.52) on 1800 units/hr. CBC stable.  For VQ scan today  Goal of Therapy:  Heparin level 0.3-0.7 units/ml Monitor platelets by anticoagulation protocol: Yes   Plan:   Continue heparin drip at 1800 units/hr.  Daily heparin level and CBC while on heparin.  Follow up VQ, anticoagulation plans.  Dennie Fetters, RPh Pager: [phone] 03/15/2017,11:40 AM

## 2017-03-15 NOTE — Progress Notes (Signed)
Pt educated about safety and importance of bed alarm during the night however pt refuses to be on bed alarm. Will continue to round on patient.   Allard Lightsey, RN    

## 2017-03-15 NOTE — Progress Notes (Signed)
Contacted by Dr. Tresa Endo, patient current admission, needs outpatient sleep study.  Order placed.   Will schedule.

## 2017-03-15 NOTE — Progress Notes (Addendum)
Heart Failure Navigator Consult Note  Presentation:  Per Dr [name]: Richard Cochran is a 31 y.o. male with past medical history of HTN and renal insufficiency who presents to [facility] on 03/13/2017 as a transfer from [facility] for an NSTEMI.   The patient reports he developed sternal chest pressure along 0300 this morning which awoke him from sleep. He denies any associated dyspnea, nausea, vomiting, or diaphoresis. He initially thought the pain was secondary to gas and he took antiacids with no relief. He took his son to school then proceeded to the ED. While there, he was given SL NTG with complete resolution of this symptoms. He denies any recurrent symptoms since. No episodes of chest pain or dyspnea prior to today.   No prior history of CAD. No known HLD or Type 2 DM. No known family history of CAD. Reports smoking cigarettes in the past but quit in his early 20's. Reports occasional alcohol use but no more than 2 drinks per week. He denies any recreational drug use. 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   Initial troponin 2.850 with repeat 9.020. Other labs show Na+ 142, K+ 4.3, creatinine 1.30 (baseline 1.2 - 1.3). WBC 13.2, Hgb 14.7, and platelets 229. D-dimer 244 (normal < 230 per reference range). PT 11.8, INR 1.04.   Past Medical History:  Diagnosis Date  . Childhood asthma   . CKD (chronic kidney disease)    "left kidney weaker than right" (03/13/2017)  . Hypertension   . NSTEMI (non-ST elevated myocardial infarction) (HCC) 03/13/2017   [name] 03/13/2017  . Renal insufficiency     Social History   Social History  . Marital status: Single    Spouse name: N/A  . Number of children: N/A  . Years of education: N/A   Social History Main Topics  . Smoking status: Former Smoker    Years: 2.00    Types: Cigarettes  . Smokeless tobacco: Never Used     Comment: 03/13/2017 "quit when I was 11 or 12"  . Alcohol use Yes     Comment: 03/13/2017 "I drink on my birthday; nothing  else"  . Drug use: No  . Sexual activity: Not Asked   Other Topics Concern  . None   Social History Narrative  . None    ECHO:Study Conclusions-03/14/17  - Left ventricle: The cavity size was severely dilated. There was   severe concentric hypertrophy. Systolic function was normal. The   estimated ejection fraction was in the range of 10% to 15%.   Diffuse hypokinesis. Doppler parameters are consistent with   abnormal left ventricular relaxation (grade 1 diastolic   dysfunction). - Aortic valve: Transvalvular velocity was within the normal range.   There was no stenosis. There was no regurgitation. - Mitral valve: Transvalvular velocity was within the normal range.   There was no evidence for stenosis. There was trivial   regurgitation. - Left atrium: The atrium was moderately dilated. - Right ventricle: The cavity size was normal. Wall thickness was   normal. Systolic function was normal. - Atrial septum: No defect or patent foramen ovale was identified. - Tricuspid valve: There was no regurgitation.  ------------------------------------------------------------------- Study data:  No prior study was available for comparison.  Study status:  Routine.  Procedure:  The patient reported no pain pre or post test. Transthoracic echocardiography. Image quality was adequate.  Study completion:  There were no complications. Transthoracic echocardiography.  M-mode, complete 2D, spectral Doppler, and color Doppler.  Birthdate:  Patient birthdate:  [birth year].  Age:  Patient is 31 yr old.  Sex:  Gender: male. BMI: 51.5 kg/m^2.  Blood pressure:     157/100  Patient status: Inpatient.  Study date:  Study date: 03/14/2017. Study time: 12:37 PM.  Location:  Bedside  Cardiac Catheterization-03/14/17 Conclusion     There is severe left ventricular systolic dysfunction.  LV end diastolic pressure is moderately elevated.  The left ventricular ejection fraction is less than 25% by  visual estimate.   1. Normal coronary anatomy 2. Marked LV enlargement with severe global hypokinesis and overall EF 15-20%. 3. Moderately elevated LVEDP  Plan: aggressive therapy for CHF.     BNP No results found for: BNP  ProBNP No results found for: PROBNP   Education Assessment and Provision:  Detailed education and instructions provided on heart failure disease management including the following:  Signs and symptoms of Heart Failure When to call the physician Importance of daily weights Low sodium diet Fluid restriction Medication management Anticipated future follow-up appointments  Patient education given on each of the above topics.  Patient acknowledges understanding and acceptance of all instructions.  Education Materials:  "Living Better With Heart Failure" Booklet, Daily Weight Tracker Tool   I spoke with patient regarding his new HF diagnosis and current hospitalization.  He lives in [place] with his girlfriend (she works at [employer]).  He tells me that he has a scale at home.  I reviewed the importance of daily weights and when to call the physician related to HF symptoms.  I also briefly reviewed a low sodium diet and high sodium foods to avoid.  He does not have insurance and has been getting medications at the [facility]- in [place].   He says that he is agreeable to follow in [place] after discharge.  He would benefit from follow-up in the [facility] Clinic for additional support provided through the HF medication program, SW assistance with ongoing Medicaid application and ongoing financial concerns.   High Risk Criteria for Readmission and/or Poor Patient Outcomes:  (Recommend Follow-up with Advanced Heart Failure Clinic)-yes he would benefit from additional SW and medication assistance provided through the AHF Clinic.   EF <30%-yes 15-20%  2 or more admissions in 6 months-No new HF  Difficult social situation-No insurance noted  Demonstrates  medication noncompliance- denies    Barriers of Care:  New HF, Knowledge and compliance  Discharge Planning:   Plans to return to home in [place] with his girlfriend.  Unfortunately he is not a candidate for HF [employer] [facility] due to living outside [place].

## 2017-03-16 DIAGNOSIS — E785 Hyperlipidemia, unspecified: Secondary | ICD-10-CM

## 2017-03-16 LAB — CBC
HCT: 48.2 % (ref 39.0–52.0)
HEMOGLOBIN: 15.2 g/dL (ref 13.0–17.0)
MCH: 26.4 pg (ref 26.0–34.0)
MCHC: 31.5 g/dL (ref 30.0–36.0)
MCV: 83.8 fL (ref 78.0–100.0)
Platelets: 229 10*3/uL (ref 150–400)
RBC: 5.75 MIL/uL (ref 4.22–5.81)
RDW: 14.1 % (ref 11.5–15.5)
WBC: 14 10*3/uL — AB (ref 4.0–10.5)

## 2017-03-16 LAB — BASIC METABOLIC PANEL
ANION GAP: 14 (ref 5–15)
BUN: 20 mg/dL (ref 6–20)
CO2: 22 mmol/L (ref 22–32)
Calcium: 9.5 mg/dL (ref 8.9–10.3)
Chloride: 102 mmol/L (ref 101–111)
Creatinine, Ser: 1.46 mg/dL — ABNORMAL HIGH (ref 0.61–1.24)
Glucose, Bld: 95 mg/dL (ref 65–99)
Potassium: 3.8 mmol/L (ref 3.5–5.1)
SODIUM: 138 mmol/L (ref 135–145)

## 2017-03-16 LAB — HEPARIN LEVEL (UNFRACTIONATED): HEPARIN UNFRACTIONATED: 0.82 [IU]/mL — AB (ref 0.30–0.70)

## 2017-03-16 MED ORDER — CARVEDILOL 6.25 MG PO TABS
9.3750 mg | ORAL_TABLET | Freq: Two times a day (BID) | ORAL | Status: DC
  Administered 2017-03-16: 17:00:00 9.375 mg via ORAL
  Filled 2017-03-16 (×2): qty 1

## 2017-03-16 MED ORDER — LOSARTAN POTASSIUM 25 MG PO TABS
25.0000 mg | ORAL_TABLET | Freq: Two times a day (BID) | ORAL | Status: DC
  Administered 2017-03-16 – 2017-03-17 (×3): 25 mg via ORAL
  Filled 2017-03-16 (×3): qty 1

## 2017-03-16 NOTE — Progress Notes (Signed)
Placed patient on continuous overnight pulse ox per MD order.

## 2017-03-16 NOTE — Progress Notes (Signed)
Progress Note  Patient Name: Richard Cochran Date of Encounter: 03/16/2017  Primary Cardiologist: New to Dr. [name] this admission  Subjective   Feels better  Inpatient Medications    Scheduled Meds: . aspirin EC  81 mg Oral Daily  . atorvastatin  20 mg Oral q1800  . carvedilol  9.375 mg Oral BID WC  . losartan  25 mg Oral q12n4p  . sodium chloride flush  3 mL Intravenous Q12H  . spironolactone  12.5 mg Oral BID   Continuous Infusions: . sodium chloride    . heparin 1,600 Units/hr (03/16/17 1010)   PRN Meds: sodium chloride, acetaminophen, ondansetron (ZOFRAN) IV, sodium chloride flush   Vital Signs    Vitals:   03/15/17 1832 03/15/17 2037 03/15/17 2325 03/16/17 0500  BP: 113/75 (!) 140/94  (!) 131/93  Pulse: 100 99  83  Resp:  18  18  Temp: 99.8 F (37.7 C) 99 F (37.2 C)  98.1 F (36.7 C)  TempSrc: Oral Oral  Oral  SpO2: 97% 94% 94% 99%  Weight:    (!) 340 lb 14.4 oz (154.6 kg)  Height:        Intake/Output Summary (Last 24 hours) at 03/16/17 1121 Last data filed at 03/16/17 1119  Gross per 24 hour  Intake            991.2 ml  Output             1800 ml  Net           -808.8 ml   Filed Weights   03/14/17 0541 03/15/17 0605 03/16/17 0500  Weight: (!) 348 lb 9.6 oz (158.1 kg) (!) 340 lb 4.8 oz (154.4 kg) (!) 340 lb 14.4 oz (154.6 kg)    Telemetry    NSR/occ sinus tach- Personally Reviewed  Physical Exam   BP (!) 131/93 (BP Location: Left Arm)   Pulse 83   Temp 98.1 F (36.7 C) (Oral)   Resp 18   Ht 5\' 9"  (1.753 m)   Wt (!) 340 lb 14.4 oz (154.6 kg)   SpO2 99%   BMI 50.34 kg/m  General: Alert, oriented, no distress. Morbid obesity Skin: normal turgor, no rashes, warm and dry HEENT: Normocephalic, atraumatic. Pupils equal round and reactive to light; sclera anicteric; extraocular muscles intact;  Nose without nasal septal hypertrophy Mouth/Parynx benign; Mallinpatti scale 3 Neck: No JVD, no carotid bruits; normal carotid upstroke Lungs:  clear to ausculatation and percussion; no wheezing or rales Chest wall: without tenderness to palpitation Heart: PMI not displaced, RRR, s1 s2 normal, 1/6 systolic murmur, no diastolic murmur, no rubs, gallops, thrills, or heaves Abdomen: soft, nontender; no hepatosplenomehaly, BS+; abdominal aorta nontender and not dilated by palpation. Back: no CVA tenderness Pulses 2+ Musculoskeletal: full range of motion, normal strength, no joint deformities Extremities: no clubbing cyanosis or edema, Homan's sign negative  Neurologic: grossly nonfocal; Cranial nerves grossly wnl Psychologic: Normal mood and affect   Labs    Chemistry  Recent Labs Lab 03/14/17 0734 03/15/17 0808 03/16/17 0622  NA 140 138 138  K 4.0 3.7 3.8  CL 108 103 102  CO2 22 24 22   GLUCOSE 93 103* 95  BUN 11 14 20   CREATININE 1.25* 1.45* 1.46*  CALCIUM 8.9 9.4 9.5  GFRNONAA >60 >60 >60  GFRAA >60 >60 >60  ANIONGAP 10 11 14      Hematology  Recent Labs Lab 03/14/17 0734 03/15/17 0808 03/16/17 0622  WBC 13.6* 15.0* 14.0*  RBC  5.37 5.58 5.75  HGB 14.4 15.2 15.2  HCT 45.5 47.1 48.2  MCV 84.7 84.4 83.8  MCH 26.8 27.2 26.4  MCHC 31.6 32.3 31.5  RDW 14.0 14.0 14.1  PLT 195 210 229    Cardiac Enzymes  Recent Labs Lab 03/13/17 1955 03/14/17 0214 03/14/17 0734  TROPONINI 12.74* 14.77* 8.92*   No results for input(s): TROPIPOC in the last 168 hours.    Radiology    Dg Chest 2 View  Result Date: 03/15/2017 CLINICAL DATA:  Chest pain and shortness of breath for 2 days. EXAM: CHEST  2 VIEW COMPARISON:  None. FINDINGS: Enlargement of the cardiopericardial silhouette noted. There is no evidence of focal airspace disease, pulmonary edema, suspicious pulmonary nodule/mass, pleural effusion, or pneumothorax. No acute bony abnormalities are identified. IMPRESSION: Enlargement of the cardiopericardial silhouette without other significant abnormality. Electronically Signed   By: Harmon Pier M.D.   On: 03/15/2017  19:33   Nm Pulmonary Perf And Vent  Result Date: 03/15/2017 CLINICAL DATA:  Shortness of breath EXAM: NUCLEAR MEDICINE VENTILATION - PERFUSION LUNG SCAN TECHNIQUE: Ventilation images were obtained in multiple projections using inhaled aerosol Tc-72m DTPA. Perfusion images were obtained in multiple projections after intravenous injection of Tc-29m MAA. RADIOPHARMACEUTICALS:  33 mCi Technetium-53m DTPA aerosol inhalation and 4.2 mCi Technetium-1m MAA IV COMPARISON:  None Correlation: Chest radiograph 03/15/2017 FINDINGS: Ventilation: Enlargement of cardiac silhouette. Diminished ventilation in lingula. Small amount of swallowed aerosol within stomach. Perfusion: Enlargement cardiac silhouette.  No perfusion defects. IMPRESSION: Enlargement of cardiac silhouette. Otherwise normal perfusion lung scan. Mild diminished ventilation in lingula. Electronically Signed   By: Ulyses Southward M.D.   On: 03/15/2017 17:49    Cardiac Studies   Cardiac Cath Conclusion    There is severe left ventricular systolic dysfunction.  LV end diastolic pressure is moderately elevated.  The left ventricular ejection fraction is less than 25% by visual estimate.  1. Normal coronary anatomy 2. Marked LV enlargement with severe global hypokinesis and overall EF 15-20%. 3. Moderately elevated LVEDP  Plan: aggressive therapy for CHF.   2D echo 03/14/17 Study Conclusions  - Left ventricle: The cavity size was severely dilated. There was   severe concentric hypertrophy. Systolic function was normal. The   estimated ejection fraction was in the range of 10% to 15%.   Diffuse hypokinesis. Doppler parameters are consistent with   abnormal left ventricular relaxation (grade 1 diastolic   dysfunction). - Aortic valve: Transvalvular velocity was within the normal range.   There was no stenosis. There was no regurgitation. - Mitral valve: Transvalvular velocity was within the normal range.   There was no evidence for  stenosis. There was trivial   regurgitation. - Left atrium: The atrium was moderately dilated. - Right ventricle: The cavity size was normal. Wall thickness was   normal. Systolic function was normal. - Atrial septum: No defect or patent foramen ovale was identified. - Tricuspid valve: There was no regurgitation.  Patient Profile     31 y.o. male with HTN, morbid obesity, former tobacco abuse, mild CKD II who presented to [facility] with new onset CP awakening him from sleep and troponin of 9 at OSH. Cardiac cath 03/14/17 with normal coronaries, EF 15-20%, moderately elevated LVEDP. 2D Echo shows EF 10-15%, grade diastolic dysfunction, moderate LAE.  Assessment & Plan    1. Elevated troponin and new onset combined CHF with severe NICM -  EF of 15% at cath; now on carvedilol 6.25 mg twice a day, losartan 25  mg, and spironolactone 12.5 twice a day.  Resting pulse is in the 90s.  Blood pressure 135/95.  Will titrate losartan to 25 g twice a day today and increase carvedilol to 9.375 mg twice a day.  An cardiac MRI was ordered to assess for myocarditis or infiltrative process.  but does not appear that this was done. Ultimately  as an outpatient patient should be transitioned to an entresto.  With severe LV dysfunction.  Will contact lifevest representative for lifevest predischarge.  If cardiac MRI scan cannot be done in the hospital setting, this can be scheduled as an outpatient.  2. HTN - remains mildly elevated, but improved with initiation of medical therapy  3. Morbid obesity with suspected OSA - will need sleep study as OP.  I have contacted my nurse, who is scheduled him for an outpatient sleep study.  The patient is super morbidly obese.  I discussed the adverse consequences of sleep apnea with reference to cardiovascular health.  4.  Mildly positive d-dimer: VQ scan negative for PE  4. Mild hyperlipidemia - given no sig CAD on cath, will decrease statin to 20mg  daily. If the patient is  tolerating statin at time of follow-up appointment, would consider rechecking liver function/lipid panel in 6-8 weeks.  5. CKD stage II - prior OP creatinines have run 1.2-1.4. Monitor with med changes post-cath.  6. Leukocytosis - per review of care everywhere, has had chronic elevation of this in the past in the 11 range. It is elevated today. Consider stress demargination. Will need f/u as OP.    Arrange for life vest to be placed prior to dc.   Jolene Provost, PA-C  03/16/2017, 11:21 AM

## 2017-03-16 NOTE — Progress Notes (Signed)
Progress Note  Patient Name: Richard Cochran Date of Encounter: 03/16/2017  Primary Cardiologist: New to Dr. [name] this admission  Subjective   Feels better  Inpatient Medications    Scheduled Meds: . aspirin EC  81 mg Oral Daily  . atorvastatin  20 mg Oral q1800  . carvedilol  6.25 mg Oral BID WC  . losartan  25 mg Oral Daily  . sodium chloride flush  3 mL Intravenous Q12H  . spironolactone  12.5 mg Oral BID   Continuous Infusions: . sodium chloride    . heparin 1,800 Units/hr (03/16/17 0341)   PRN Meds: sodium chloride, acetaminophen, ondansetron (ZOFRAN) IV, sodium chloride flush   Vital Signs    Vitals:   03/15/17 1832 03/15/17 2037 03/15/17 2325 03/16/17 0500  BP: 113/75 (!) 140/94  (!) 131/93  Pulse: 100 99  83  Resp:  18  18  Temp: 99.8 F (37.7 C) 99 F (37.2 C)  98.1 F (36.7 C)  TempSrc: Oral Oral  Oral  SpO2: 97% 94% 94% 99%  Weight:    (!) 340 lb 14.4 oz (154.6 kg)  Height:        Intake/Output Summary (Last 24 hours) at 03/16/17 0954 Last data filed at 03/16/17 0910  Gross per 24 hour  Intake           1171.2 ml  Output             1550 ml  Net           -378.8 ml   Filed Weights   03/14/17 0541 03/15/17 0605 03/16/17 0500  Weight: (!) 348 lb 9.6 oz (158.1 kg) (!) 340 lb 4.8 oz (154.4 kg) (!) 340 lb 14.4 oz (154.6 kg)    Telemetry    NSR/occ sinus tach- Personally Reviewed  Physical Exam   BP (!) 131/93 (BP Location: Left Arm)   Pulse 83   Temp 98.1 F (36.7 C) (Oral)   Resp 18   Ht 5\' 9"  (1.753 m)   Wt (!) 340 lb 14.4 oz (154.6 kg)   SpO2 99%   BMI 50.34 kg/m  General: Alert, oriented, no distress. Morbid obesity Skin: normal turgor, no rashes, warm and dry HEENT: Normocephalic, atraumatic. Pupils equal round and reactive to light; sclera anicteric; extraocular muscles intact;  Nose without nasal septal hypertrophy Mouth/Parynx benign; Mallinpatti scale 3 Neck: No JVD, no carotid bruits; normal carotid upstroke Lungs:  clear to ausculatation and percussion; no wheezing or rales Chest wall: without tenderness to palpitation Heart: PMI not displaced, RRR, s1 s2 normal, 1/6 systolic murmur, no diastolic murmur, no rubs, gallops, thrills, or heaves Abdomen: soft, nontender; no hepatosplenomehaly, BS+; abdominal aorta nontender and not dilated by palpation. Back: no CVA tenderness Pulses 2+ Musculoskeletal: full range of motion, normal strength, no joint deformities Extremities: no clubbing cyanosis or edema, Homan's sign negative  Neurologic: grossly nonfocal; Cranial nerves grossly wnl Psychologic: Normal mood and affect   Labs    Chemistry  Recent Labs Lab 03/14/17 0734 03/15/17 0808 03/16/17 0622  NA 140 138 138  K 4.0 3.7 3.8  CL 108 103 102  CO2 22 24 22   GLUCOSE 93 103* 95  BUN 11 14 20   CREATININE 1.25* 1.45* 1.46*  CALCIUM 8.9 9.4 9.5  GFRNONAA >60 >60 >60  GFRAA >60 >60 >60  ANIONGAP 10 11 14      Hematology  Recent Labs Lab 03/14/17 0734 03/15/17 0808 03/16/17 0622  WBC 13.6* 15.0* 14.0*  RBC 5.37  5.58 5.75  HGB 14.4 15.2 15.2  HCT 45.5 47.1 48.2  MCV 84.7 84.4 83.8  MCH 26.8 27.2 26.4  MCHC 31.6 32.3 31.5  RDW 14.0 14.0 14.1  PLT 195 210 229    Cardiac Enzymes  Recent Labs Lab 03/13/17 1955 03/14/17 0214 03/14/17 0734  TROPONINI 12.74* 14.77* 8.92*   No results for input(s): TROPIPOC in the last 168 hours.    Radiology    Dg Chest 2 View  Result Date: 03/15/2017 CLINICAL DATA:  Chest pain and shortness of breath for 2 days. EXAM: CHEST  2 VIEW COMPARISON:  None. FINDINGS: Enlargement of the cardiopericardial silhouette noted. There is no evidence of focal airspace disease, pulmonary edema, suspicious pulmonary nodule/mass, pleural effusion, or pneumothorax. No acute bony abnormalities are identified. IMPRESSION: Enlargement of the cardiopericardial silhouette without other significant abnormality. Electronically Signed   By: Harmon Pier M.D.   On: 03/15/2017  19:33   Nm Pulmonary Perf And Vent  Result Date: 03/15/2017 CLINICAL DATA:  Shortness of breath EXAM: NUCLEAR MEDICINE VENTILATION - PERFUSION LUNG SCAN TECHNIQUE: Ventilation images were obtained in multiple projections using inhaled aerosol Tc-60m DTPA. Perfusion images were obtained in multiple projections after intravenous injection of Tc-11m MAA. RADIOPHARMACEUTICALS:  33 mCi Technetium-2m DTPA aerosol inhalation and 4.2 mCi Technetium-68m MAA IV COMPARISON:  None Correlation: Chest radiograph 03/15/2017 FINDINGS: Ventilation: Enlargement of cardiac silhouette. Diminished ventilation in lingula. Small amount of swallowed aerosol within stomach. Perfusion: Enlargement cardiac silhouette.  No perfusion defects. IMPRESSION: Enlargement of cardiac silhouette. Otherwise normal perfusion lung scan. Mild diminished ventilation in lingula. Electronically Signed   By: Ulyses Southward M.D.   On: 03/15/2017 17:49    Cardiac Studies   Cardiac Cath Conclusion    There is severe left ventricular systolic dysfunction.  LV end diastolic pressure is moderately elevated.  The left ventricular ejection fraction is less than 25% by visual estimate.  1. Normal coronary anatomy 2. Marked LV enlargement with severe global hypokinesis and overall EF 15-20%. 3. Moderately elevated LVEDP  Plan: aggressive therapy for CHF.   2D echo 03/14/17 Study Conclusions  - Left ventricle: The cavity size was severely dilated. There was   severe concentric hypertrophy. Systolic function was normal. The   estimated ejection fraction was in the range of 10% to 15%.   Diffuse hypokinesis. Doppler parameters are consistent with   abnormal left ventricular relaxation (grade 1 diastolic   dysfunction). - Aortic valve: Transvalvular velocity was within the normal range.   There was no stenosis. There was no regurgitation. - Mitral valve: Transvalvular velocity was within the normal range.   There was no evidence for  stenosis. There was trivial   regurgitation. - Left atrium: The atrium was moderately dilated. - Right ventricle: The cavity size was normal. Wall thickness was   normal. Systolic function was normal. - Atrial septum: No defect or patent foramen ovale was identified. - Tricuspid valve: There was no regurgitation.  Patient Profile     31 y.o. male with HTN, morbid obesity, former tobacco abuse, mild CKD II who presented to [facility] with new onset CP awakening him from sleep and troponin of 9 at OSH. Cardiac cath 03/14/17 with normal coronaries, EF 15-20%, moderately elevated LVEDP. 2D Echo shows EF 10-15%, grade diastolic dysfunction, moderate LAE.  Assessment & Plan    1. Elevated troponin and new onset combined CHF with severe NICM -  EF of 15% at cath; now on carvedilol 6.25 mg twice a day, losartan 25 mg,  and spironolactone 12.5 twice a day.  Resting pulse is in the 90s.  Blood pressure 135/95.  Will titrate losartan to 25 g twice a day today and increase carvedilol to 9.375 mg twice a day.  An cardiac MRI was ordered to assess for myocarditis or infiltrative process.  but does not appear that this was done. Ultimately  as an outpatient patient should be transitioned to an entresto.  With severe LV dysfunction.  Will contact lifevest representative for lifevest predischarge.  If cardiac MRI scan cannot be done in the hospital setting, this can be scheduled as an outpatient.  2. HTN - remains mildly elevated, but improved with initiation of medical therapy  3. Morbid obesity with suspected OSA - will need sleep study as OP.  I have contacted my nurse, who is scheduled him for an outpatient sleep study.  The patient is super morbidly obese.  I discussed the adverse consequences of sleep apnea with reference to cardiovascular health.  4.  Mildly positive d-dimer: VQ scan negative for PE  4. Mild hyperlipidemia - given no sig CAD on cath, will decrease statin to 20mg  daily. If the patient is  tolerating statin at time of follow-up appointment, would consider rechecking liver function/lipid panel in 6-8 weeks.  5. CKD stage II - prior OP creatinines have run 1.2-1.4. Monitor with med changes post-cath.  6. Leukocytosis - per review of care everywhere, has had chronic elevation of this in the past in the 11 range. It is elevated today. Consider stress demargination. Will need f/u as OP.   Will  plan for med titration today and probable discharge tomorrow.  Arrange for life vest to be placed prior to dc.   Signed, Nicki [place], MD  03/16/2017, 9:54 AM

## 2017-03-16 NOTE — Plan of Care (Signed)
Problem: Activity: Goal: Risk for activity intolerance will decrease Outcome: Progressing Patient ambulated in hallway today and tolerated well.

## 2017-03-16 NOTE — Progress Notes (Signed)
Patient and wife requested information on diet, specific to heart failure and carbohydrate modified diets.  The following exit care notes were printed and reviewed with patient and his wife: Cooking with less Salt DASH Eating Plan Heart Healthy Eating Plan Carbohydrate Counting for diabetes Mellitus, Adult Preventing Heart Failure  Above print outs are in addition to diet information reviewed in Living Better with Heart Failure Booklet.

## 2017-03-16 NOTE — Progress Notes (Signed)
ANTICOAGULATION CONSULT NOTE - Follow Up Consult  Pharmacy Consult for Heparin Indication: ACS/NSTEMI  No Known Allergies  Patient Measurements: Height: 5\' 9"  (175.3 cm) Weight: (!) 340 lb 14.4 oz (154.6 kg) IBW/kg (Calculated) : 70.7 Heparin Dosing Weight: 110 kg  Vital Signs: Temp: 98.1 F (36.7 C) (09/01 0500) Temp Source: Oral (09/01 0500) BP: 131/93 (09/01 0500) Pulse Rate: 83 (09/01 0500)  Labs:  Recent Labs  03/13/17 1955  03/14/17 0214  03/14/17 0734 03/15/17 0808 03/16/17 0622  HGB  --   --   --   < > 14.4 15.2 15.2  HCT  --   --   --   --  45.5 47.1 48.2  PLT  --   --   --   --  195 210 229  LABPROT  --   --   --   --  13.6  --   --   INR  --   --   --   --  1.05  --   --   HEPARINUNFRC  --   < > 0.73*  --  1.01* 0.52 0.82*  CREATININE  --   --   --   --  1.25* 1.45* 1.46*  TROPONINI 12.74*  --  14.77*  --  8.92*  --   --   < > = values in this interval not displayed.  Estimated Creatinine Clearance: 107.2 mL/min (A) (by C-G formula based on SCr of 1.46 mg/dL (H)).  Assessment:  31 y.o male transferred from [facility] for NSTEMI. Heparin begun while and [facility] and continued at [facility]. Held for cath 8/30, then resumed 8 hrs post-TR band removal.  Heparin level is supratherapeutic (0.82) on 1800 units/hr. CBC stable and no bleeding noted.  Goal of Therapy:  Heparin level 0.3-0.7 units/ml Monitor platelets by anticoagulation protocol: Yes   Plan:  Decrease heparin drip to 1600 units/hr. Recheck HL at 1500 Daily heparin level and CBC while on heparin. Follow up VQ, anticoagulation plans  Nolen Mu PharmD PGY1 Pharmacy Practice Resident 03/16/2017 8:44 AM Pager: [phone]

## 2017-03-16 NOTE — Progress Notes (Signed)
Pt's sleep study placed in his chart.

## 2017-03-17 DIAGNOSIS — IMO0001 Reserved for inherently not codable concepts without codable children: Secondary | ICD-10-CM

## 2017-03-17 DIAGNOSIS — Z0389 Encounter for observation for other suspected diseases and conditions ruled out: Secondary | ICD-10-CM

## 2017-03-17 LAB — CBC
HCT: 46.9 % (ref 39.0–52.0)
HEMOGLOBIN: 14.9 g/dL (ref 13.0–17.0)
MCH: 26.8 pg (ref 26.0–34.0)
MCHC: 31.8 g/dL (ref 30.0–36.0)
MCV: 84.2 fL (ref 78.0–100.0)
Platelets: 212 10*3/uL (ref 150–400)
RBC: 5.57 MIL/uL (ref 4.22–5.81)
RDW: 13.8 % (ref 11.5–15.5)
WBC: 13.2 10*3/uL — ABNORMAL HIGH (ref 4.0–10.5)

## 2017-03-17 LAB — BASIC METABOLIC PANEL
ANION GAP: 13 (ref 5–15)
BUN: 25 mg/dL — AB (ref 6–20)
CALCIUM: 9.4 mg/dL (ref 8.9–10.3)
CO2: 21 mmol/L — AB (ref 22–32)
CREATININE: 1.75 mg/dL — AB (ref 0.61–1.24)
Chloride: 104 mmol/L (ref 101–111)
GFR calc Af Amer: 58 mL/min — ABNORMAL LOW (ref >60)
GFR calc non Af Amer: 50 mL/min — ABNORMAL LOW (ref >60)
GLUCOSE: 88 mg/dL (ref 65–99)
Potassium: 4 mmol/L (ref 3.5–5.1)
Sodium: 138 mmol/L (ref 135–145)

## 2017-03-17 LAB — CBC WITH DIFFERENTIAL/PLATELET
BASOS PCT: 1 %
Basophils Absolute: 0.1 10*3/uL (ref 0.0–0.1)
EOS PCT: 2 %
Eosinophils Absolute: 0.2 10*3/uL (ref 0.0–0.7)
HEMATOCRIT: 48.7 % (ref 39.0–52.0)
Hemoglobin: 15.4 g/dL (ref 13.0–17.0)
Lymphocytes Relative: 22 %
Lymphs Abs: 2.3 10*3/uL (ref 0.7–4.0)
MCH: 26.9 pg (ref 26.0–34.0)
MCHC: 31.6 g/dL (ref 30.0–36.0)
MCV: 85 fL (ref 78.0–100.0)
MONO ABS: 0.9 10*3/uL (ref 0.1–1.0)
MONOS PCT: 9 %
NEUTROS ABS: 7 10*3/uL (ref 1.7–7.7)
Neutrophils Relative %: 66 %
Platelets: 234 10*3/uL (ref 150–400)
RBC: 5.73 MIL/uL (ref 4.22–5.81)
RDW: 13.9 % (ref 11.5–15.5)
WBC: 10.4 10*3/uL (ref 4.0–10.5)

## 2017-03-17 LAB — TROPONIN I: Troponin I: 3.24 ng/mL (ref <0.03)

## 2017-03-17 LAB — BRAIN NATRIURETIC PEPTIDE: B Natriuretic Peptide: 30 pg/mL (ref 0.0–100.0)

## 2017-03-17 MED ORDER — LOSARTAN POTASSIUM 25 MG PO TABS
25.0000 mg | ORAL_TABLET | Freq: Two times a day (BID) | ORAL | 3 refills | Status: DC
Start: 2017-03-17 — End: 2017-03-26

## 2017-03-17 MED ORDER — CARVEDILOL 12.5 MG PO TABS
12.5000 mg | ORAL_TABLET | Freq: Two times a day (BID) | ORAL | Status: DC
  Administered 2017-03-17 (×2): 12.5 mg via ORAL
  Filled 2017-03-17 (×2): qty 1

## 2017-03-17 MED ORDER — ACETAMINOPHEN 325 MG PO TABS: 650.0000 mg | ORAL_TABLET | ORAL | Status: AC | PRN

## 2017-03-17 MED ORDER — CARVEDILOL 12.5 MG PO TABS: 12.5000 mg | ORAL_TABLET | Freq: Two times a day (BID) | ORAL | 3 refills | Status: DC

## 2017-03-17 MED ORDER — CARVEDILOL 12.5 MG PO TABS: 12.5000 mg | ORAL_TABLET | Freq: Two times a day (BID) | ORAL | Status: DC

## 2017-03-17 MED ORDER — ATORVASTATIN CALCIUM 20 MG PO TABS: 20.0000 mg | ORAL_TABLET | Freq: Every day | ORAL | 3 refills | Status: DC

## 2017-03-17 MED ORDER — SPIRONOLACTONE 25 MG PO TABS
12.5000 mg | ORAL_TABLET | Freq: Every day | ORAL | Status: DC
  Administered 2017-03-17: 12.5 mg via ORAL
  Filled 2017-03-17: qty 1

## 2017-03-17 MED ORDER — SPIRONOLACTONE 25 MG PO TABS: 12.5000 mg | ORAL_TABLET | Freq: Every day | ORAL | 3 refills | Status: DC

## 2017-03-17 NOTE — Progress Notes (Signed)
Pt has orders to be discharged. Discharge instructions given and pt has no additional questions at this time. Medication regimen reviewed and pt educated. Pt verbalized understanding and has no additional questions. Telemetry box removed. IV removed and site in good condition. Pt stable and waiting for transportation. 

## 2017-03-17 NOTE — Progress Notes (Signed)
Progress Note  Patient Name: Richard Cochran Date of Encounter: 03/17/2017  Primary Cardiologist: New to Dr. [name] this admission  Subjective   Feels better; no chest pain or shortness of breath.  Inpatient Medications    Scheduled Meds: . aspirin EC  81 mg Oral Daily  . atorvastatin  20 mg Oral q1800  . carvedilol  9.375 mg Oral BID WC  . losartan  25 mg Oral q12n4p  . sodium chloride flush  3 mL Intravenous Q12H  . spironolactone  12.5 mg Oral BID   Continuous Infusions: . sodium chloride     PRN Meds: sodium chloride, acetaminophen, ondansetron (ZOFRAN) IV, sodium chloride flush   Vital Signs    Vitals:   03/16/17 0500 03/16/17 1149 03/16/17 2110 03/17/17 0537  BP: (!) 131/93 121/81 112/82 126/80  Pulse: 83 86 99 84  Resp: 18 20 20 20   Temp: 98.1 F (36.7 C) 98.9 F (37.2 C) 98.3 F (36.8 C) 97.9 F (36.6 C)  TempSrc: Oral Oral Oral Oral  SpO2: 99% 94% 99% 100%  Weight: (!) 340 lb 14.4 oz (154.6 kg)   (!) 341 lb 14.4 oz (155.1 kg)  Height:        Intake/Output Summary (Last 24 hours) at 03/17/17 0806 Last data filed at 03/17/17 0600  Gross per 24 hour  Intake            719.8 ml  Output              825 ml  Net           -105.2 ml   I/O's since admission: -4002  Filed Weights   03/15/17 0605 03/16/17 0500 03/17/17 0537  Weight: (!) 340 lb 4.8 oz (154.4 kg) (!) 340 lb 14.4 oz (154.6 kg) (!) 341 lb 14.4 oz (155.1 kg)    Telemetry    NSR/occ sinus tach- Personally Reviewed  Physical Exam   BP 126/80 (BP Location: Left Arm)   Pulse 84   Temp 97.9 F (36.6 C) (Oral)   Resp 20   Ht 5\' 9"  (1.753 m)   Wt (!) 341 lb 14.4 oz (155.1 kg)   SpO2 100%   BMI 50.49 kg/m    General: Morbidly obese; Alert, oriented, no distress.  Skin: normal turgor, no rashes, warm and dry HEENT: Normocephalic, atraumatic. Pupils equal round and reactive to light; sclera anicteric; extraocular muscles intact;  Nose without nasal septal hypertrophy Mouth/Parynx  benign; Mallinpatti scale 3 Neck: No JVD, no carotid bruits; normal carotid upstroke Lungs: clear to ausculatation and percussion; no wheezing or rales Chest wall: without tenderness to palpitation Heart: PMI not displaced, RRR, s1 s2 normal, 1/6 systolic murmur, no diastolic murmur, no rubs, gallops, thrills, or heaves Abdomen: soft, nontender; no hepatosplenomehaly, BS+; abdominal aorta nontender and not dilated by palpation. Back: no CVA tenderness Pulses 2+ Musculoskeletal: full range of motion, normal strength, no joint deformities Extremities: no clubbing cyanosis or edema, Homan's sign negative  Neurologic: grossly nonfocal; Cranial nerves grossly wnl Psychologic: Normal mood and affect    Labs    Chemistry  Recent Labs Lab 03/15/17 0808 03/16/17 0622 03/17/17 0505  NA 138 138 138  K 3.7 3.8 4.0  CL 103 102 104  CO2 24 22 21*  GLUCOSE 103* 95 88  BUN 14 20 25*  CREATININE 1.45* 1.46* 1.75*  CALCIUM 9.4 9.5 9.4  GFRNONAA >60 >60 50*  GFRAA >60 >60 58*  ANIONGAP 11 14 13      Hematology  Recent Labs Lab 03/15/17 0808 03/16/17 0622 03/17/17 0505  WBC 15.0* 14.0* 13.2*  RBC 5.58 5.75 5.57  HGB 15.2 15.2 14.9  HCT 47.1 48.2 46.9  MCV 84.4 83.8 84.2  MCH 27.2 26.4 26.8  MCHC 32.3 31.5 31.8  RDW 14.0 14.1 13.8  PLT 210 229 212    Cardiac Enzymes  Recent Labs Lab 03/13/17 1955 03/14/17 0214 03/14/17 0734  TROPONINI 12.74* 14.77* 8.92*   No results for input(s): TROPIPOC in the last 168 hours. BNP (last 3 results) No results for input(s): BNP in the last 8760 hours.  ProBNP (last 3 results) No results for input(s): PROBNP in the last 8760 hours.  Lipid Panel     Component Value Date/Time   CHOL 175 03/14/2017 0214   TRIG 88 03/14/2017 0214   HDL 53 03/14/2017 0214   CHOLHDL 3.3 03/14/2017 0214   VLDL 18 03/14/2017 0214   LDLCALC 104 (H) 03/14/2017 0214    Radiology    Dg Chest 2 View  Result Date: 03/15/2017 CLINICAL DATA:  Chest pain  and shortness of breath for 2 days. EXAM: CHEST  2 VIEW COMPARISON:  None. FINDINGS: Enlargement of the cardiopericardial silhouette noted. There is no evidence of focal airspace disease, pulmonary edema, suspicious pulmonary nodule/mass, pleural effusion, or pneumothorax. No acute bony abnormalities are identified. IMPRESSION: Enlargement of the cardiopericardial silhouette without other significant abnormality. Electronically Signed   By: Harmon Pier M.D.   On: 03/15/2017 19:33   Nm Pulmonary Perf And Vent  Result Date: 03/15/2017 CLINICAL DATA:  Shortness of breath EXAM: NUCLEAR MEDICINE VENTILATION - PERFUSION LUNG SCAN TECHNIQUE: Ventilation images were obtained in multiple projections using inhaled aerosol Tc-4m DTPA. Perfusion images were obtained in multiple projections after intravenous injection of Tc-12m MAA. RADIOPHARMACEUTICALS:  33 mCi Technetium-67m DTPA aerosol inhalation and 4.2 mCi Technetium-26m MAA IV COMPARISON:  None Correlation: Chest radiograph 03/15/2017 FINDINGS: Ventilation: Enlargement of cardiac silhouette. Diminished ventilation in lingula. Small amount of swallowed aerosol within stomach. Perfusion: Enlargement cardiac silhouette.  No perfusion defects. IMPRESSION: Enlargement of cardiac silhouette. Otherwise normal perfusion lung scan. Mild diminished ventilation in lingula. Electronically Signed   By: Ulyses Southward M.D.   On: 03/15/2017 17:49    Cardiac Studies   Cardiac Cath Conclusion    There is severe left ventricular systolic dysfunction.  LV end diastolic pressure is moderately elevated.  The left ventricular ejection fraction is less than 25% by visual estimate.  1. Normal coronary anatomy 2. Marked LV enlargement with severe global hypokinesis and overall EF 15-20%. 3. Moderately elevated LVEDP  Plan: aggressive therapy for CHF.   2D echo 03/14/17 Study Conclusions  - Left ventricle: The cavity size was severely dilated. There was   severe  concentric hypertrophy. Systolic function was normal. The   estimated ejection fraction was in the range of 10% to 15%.   Diffuse hypokinesis. Doppler parameters are consistent with   abnormal left ventricular relaxation (grade 1 diastolic   dysfunction). - Aortic valve: Transvalvular velocity was within the normal range.   There was no stenosis. There was no regurgitation. - Mitral valve: Transvalvular velocity was within the normal range.   There was no evidence for stenosis. There was trivial   regurgitation. - Left atrium: The atrium was moderately dilated. - Right ventricle: The cavity size was normal. Wall thickness was   normal. Systolic function was normal. - Atrial septum: No defect or patent foramen ovale was identified. - Tricuspid valve: There was no regurgitation.  Patient  Profile     31 y.o. male with HTN, morbid obesity, former tobacco abuse, mild CKD II who presented to [facility] with new onset CP awakening him from sleep and troponin of 9 at OSH. Cardiac cath 03/14/17 with normal coronaries, EF 15-20%, moderately elevated LVEDP. 2D Echo shows EF 10-15%, grade diastolic dysfunction, moderate LAE.  Assessment & Plan    1. Elevated troponin and new onset combined CHF with severe NICM -  EF of 15% at cath; now on carvedilol 9.375mg  twice a day, losartan 25 mg bid increased yesterday, and spironolactone 12.5 twice a day.  Resting pulse is now in the upper 80s.  There is a rare isolated PVC.  He had been scheduled for cardiac MRI, but unfortunately due to his body size .  It was felt that he would not fit into the camera and as result, this was unable to be done.  I had contacted him the LifeVest personnel for him to have a LifeVest prior to discharge.  They're working on trying to get approval from the administration since he at present has no insurance and is working on [place].  I will recheck a troponin level this morning as well as a follow-up BNP.  Although he will get  approval for LifeVest and will be discharged once his LifeVest is applied.  2. HTN - improved today, now on his current medical regimen.  Will further titrate carvedilol to 12.5 mill grams twice a day with his pulse in the upper 80s with occasional PVC.  3. Morbid obesity with suspected OSA - will need sleep study as OP.  I have contacted my nurse, who scheduled him for an outpatient sleep study.  The patient is super morbidly obese.  I discussed the adverse consequences of sleep apnea with reference to cardiovascular health.  4.  Mildly positive d-dimer: VQ scan negative for PE  4. Mild hyperlipidemia -  he is now on atorvastatin 20 mg daily.  LDL is 104.  5. CKD stage III: Prior creatinines have run a 1.2-1.4 range.  His creatinine is increased with losartan and spironolactone.  Creatinine today is 1.75.  Decrease spironolactone to 12.5 mg daily.  6. Leukocytosis - per review of care everywhere, has had chronic elevation of this in the past in the 11 range.  White blood count had been elevated to 15.o; slowly improving now.  313.2 today.  Suspect this may be somewhat related to potential inflammatory myocarditis.  Will recheck troponin to see if this has almost normalized and follow-up BMP today.  Await further word from [place] in the near, the LifeVest representative who is waiting to hear from [facility] administration concerning the patient's LifeVest eligibility.  If this can be placed today, possible discharge later today or tomorrow.  Signed, Nicki Guadalajara, MD  03/17/2017, 8:06 AM

## 2017-03-17 NOTE — Discharge Instructions (Signed)
Heart Failure °Heart failure means your heart has trouble pumping blood. This makes it hard for your body to work well. Heart failure is usually a long-term (chronic) condition. You must take good care of yourself and follow your doctor's treatment plan. °Follow these instructions at home: °· Take your heart medicine as told by your doctor. °? Do not stop taking medicine unless your doctor tells you to. °? Do not skip any dose of medicine. °? Refill your medicines before they run out. °? Take other medicines only as told by your doctor or pharmacist. °· Stay active if told by your doctor. The elderly and people with severe heart failure should talk with a doctor about physical activity. °· Eat heart-healthy foods. Choose foods that are without trans fat and are low in saturated fat, cholesterol, and salt (sodium). This includes fresh or frozen fruits and vegetables, fish, lean meats, fat-free or low-fat dairy foods, whole grains, and high-fiber foods. Lentils and dried peas and beans (legumes) are also good choices. °· Limit salt if told by your doctor. °· Cook in a healthy way. Roast, grill, broil, bake, poach, steam, or stir-fry foods. °· Limit fluids as told by your doctor. °· Weigh yourself every morning. Do this after you pee (urinate) and before you eat breakfast. Write down your weight to give to your doctor. °· Take your blood pressure and write it down if your doctor tells you to. °· Ask your doctor how to check your pulse. Check your pulse as told. °· Lose weight if told by your doctor. °· Stop smoking or chewing tobacco. Do not use gum or patches that help you quit without your doctor's approval. °· Schedule and go to doctor visits as told. °· Nonpregnant women should have no more than 1 drink a day. Men should have no more than 2 drinks a day. Talk to your doctor about drinking alcohol. °· Stop illegal drug use. °· Stay current with shots (immunizations). °· Manage your health conditions as told by your  doctor. °· Learn to manage your stress. °· Rest when you are tired. °· If it is really hot outside: °? Avoid intense activities. °? Use air conditioning or fans, or get in a cooler place. °? Avoid caffeine and alcohol. °? Wear loose-fitting, lightweight, and light-colored clothing. °· If it is really cold outside: °? Avoid intense activities. °? Layer your clothing. °? Wear mittens or gloves, a hat, and a scarf when going outside. °? Avoid alcohol. °· Learn about heart failure and get support as needed. °· Get help to maintain or improve your quality of life and your ability to care for yourself as needed. °Contact a doctor if: °· You gain weight quickly. °· You are more short of breath than usual. °· You cannot do your normal activities. °· You tire easily. °· You cough more than normal, especially with activity. °· You have any or more puffiness (swelling) in areas such as your hands, feet, ankles, or belly (abdomen). °· You cannot sleep because it is hard to breathe. °· You feel like your heart is beating fast (palpitations). °· You get dizzy or light-headed when you stand up. °Get help right away if: °· You have trouble breathing. °· There is a change in mental status, such as becoming less alert or not being able to focus. °· You have chest pain or discomfort. °· You faint. °This information is not intended to replace advice given to you by your health care provider. Make sure you   discuss any questions you have with your health care provider. °Document Released: 04/10/2008 Document Revised: 12/08/2015 Document Reviewed: 08/18/2012 °Elsevier Interactive Patient Education © 2017 [employer] ° °

## 2017-03-17 NOTE — Discharge Summary (Signed)
Patient ID: Richard Cochran,  MRN: [id number], DOB/AGE: 17-Jan-1986 31 y.o.  Admit date: 03/13/2017 Discharge date: 03/17/2017  Primary Care Provider: System, Pcp Not In Primary Cardiologist: Dr [name] (new)  Discharge Diagnoses Principal Problem:   NSTEMI (non-ST elevated myocardial infarction) [facility]) Active Problems:   NICM (nonischemic cardiomyopathy) (HCC)   Acute on chronic combined systolic and diastolic CHF (congestive heart failure) (HCC)   Essential hypertension   Hyperlipidemia   CKD (chronic kidney disease), stage II   Leukocytosis   Normal coronary arteries   Morbid obesity (HCC)    Procedures: Coronary angiogram 03/14/17                        Echo cardiogram 03/14/17   Hospital Course 31 y/o morbidly obese male (BMI 67) from [place] with a history of HTN and CRI. He presented to [facility] with SSCP8/29/18. His Troponin's were elevated and he was transferred to [facility] as a NSTEMI. His Troponin peaked at 14 here. Echo done 03/14/17 showed an EF of 10-15% with severe LVH and diffuse HK. Cath done 03/14/17 showed normal coronaries. He did have an elevated LVEDP and he was diuresed 8 lbs. He is morbidly obese and we suspect he has sleep apnea as well. Dr Tresa Endo feels the pt should be discharged with a Life Vest and this is being arranged.   Discharge Vitals:  Blood pressure 133/88, pulse 94, temperature 98.4 F (36.9 C), temperature source Oral, resp. rate 20, height 5\' 9"  (1.753 m), weight (!) 341 lb 14.4 oz (155.1 kg), SpO2 99 %.    Labs: Results for orders placed or performed during the hospital encounter of 03/13/17 (from the past 24 hour(s))  Basic metabolic panel     Status: Abnormal   Collection Time: 03/17/17  5:05 AM  Result Value Ref Range   Sodium 138 135 - 145 mmol/L   Potassium 4.0 3.5 - 5.1 mmol/L   Chloride 104 101 - 111 mmol/L   CO2 21 (L) 22 - 32 mmol/L   Glucose, Bld 88 65 - 99 mg/dL   BUN 25 (H) 6 - 20 mg/dL   Creatinine, Ser 2.72 (H) 0.61 -  1.24 mg/dL   Calcium 9.4 8.9 - 53.6 mg/dL   GFR calc non Af Amer 50 (L) >60 mL/min   GFR calc Af Amer 58 (L) >60 mL/min   Anion gap 13 5 - 15  CBC     Status: Abnormal   Collection Time: 03/17/17  5:05 AM  Result Value Ref Range   WBC 13.2 (H) 4.0 - 10.5 K/uL   RBC 5.57 4.22 - 5.81 MIL/uL   Hemoglobin 14.9 13.0 - 17.0 g/dL   HCT 64.4 03.4 - 74.2 %   MCV 84.2 78.0 - 100.0 fL   MCH 26.8 26.0 - 34.0 pg   MCHC 31.8 30.0 - 36.0 g/dL   RDW 59.5 63.8 - 75.6 %   Platelets 212 150 - 400 K/uL  Troponin I     Status: Abnormal   Collection Time: 03/17/17  8:55 AM  Result Value Ref Range   Troponin I 3.24 (HH) <0.03 ng/mL  Brain natriuretic peptide     Status: None   Collection Time: 03/17/17  8:55 AM  Result Value Ref Range   B Natriuretic Peptide 30.0 0.0 - 100.0 pg/mL  CBC with Differential/Platelet     Status: None   Collection Time: 03/17/17  8:55 AM  Result Value Ref Range  WBC 10.4 4.0 - 10.5 K/uL   RBC 5.73 4.22 - 5.81 MIL/uL   Hemoglobin 15.4 13.0 - 17.0 g/dL   HCT 16.1 09.6 - 04.5 %   MCV 85.0 78.0 - 100.0 fL   MCH 26.9 26.0 - 34.0 pg   MCHC 31.6 30.0 - 36.0 g/dL   RDW 40.9 81.1 - 91.4 %   Platelets 234 150 - 400 K/uL   Neutrophils Relative % 66 %   Neutro Abs 7.0 1.7 - 7.7 K/uL   Lymphocytes Relative 22 %   Lymphs Abs 2.3 0.7 - 4.0 K/uL   Monocytes Relative 9 %   Monocytes Absolute 0.9 0.1 - 1.0 K/uL   Eosinophils Relative 2 %   Eosinophils Absolute 0.2 0.0 - 0.7 K/uL   Basophils Relative 1 %   Basophils Absolute 0.1 0.0 - 0.1 K/uL    Disposition:  Follow-up Information    Jake Bathe, MD Follow up.   Specialty:  Cardiology Why:  office will conatct you Contact information: [address] [place] [address] [phone]           Discharge Medications:  Allergies as of 03/17/2017   No Known Allergies     Medication List    STOP taking these medications   amLODipine 10 MG tablet Commonly known as:  NORVASC   lisinopril 10  MG tablet Commonly known as:  PRINIVIL,ZESTRIL     TAKE these medications   acetaminophen 325 MG tablet Commonly known as:  TYLENOL Take 2 tablets (650 mg total) by mouth every 4 (four) hours as needed for headache or mild pain.   aspirin EC 81 MG tablet Take 81 mg by mouth daily.   atorvastatin 20 MG tablet Commonly known as:  LIPITOR Take 1 tablet (20 mg total) by mouth daily at 6 PM.   carvedilol 12.5 MG tablet Commonly known as:  COREG Take 1 tablet (12.5 mg total) by mouth 2 (two) times daily with a meal.   losartan 25 MG tablet Commonly known as:  COZAAR Take 1 tablet (25 mg total) by mouth 2 times daily at 12 noon and 4 pm.   spironolactone 25 MG tablet Commonly known as:  ALDACTONE Take 0.5 tablets (12.5 mg total) by mouth daily.            Discharge Care Instructions        Start     Ordered   03/18/17 0000  spironolactone (ALDACTONE) 25 MG tablet  Daily    Question:  Supervising Provider  Answer:  Runell Gess   03/17/17 1440   03/17/17 0000  acetaminophen (TYLENOL) 325 MG tablet  Every 4 hours PRN    Question:  Supervising Provider  Answer:  Runell Gess   03/17/17 1440   03/17/17 0000  atorvastatin (LIPITOR) 20 MG tablet  Daily-1800    Question:  Supervising Provider  Answer:  Nanetta Batty J   03/17/17 1440   03/17/17 0000  carvedilol (COREG) 12.5 MG tablet  2 times daily with meals    Question:  Supervising Provider  Answer:  Runell Gess   03/17/17 1440   03/17/17 0000  losartan (COZAAR) 25 MG tablet  2 times daily    Question:  Supervising Provider  Answer:  Runell Gess   03/17/17 1440       Duration of Discharge Encounter: Greater than 30 minutes including physician time.  Jolene Provost PA-C 03/17/2017 2:41 PM

## 2017-03-19 ENCOUNTER — Telehealth (HOSPITAL_COMMUNITY): Payer: Self-pay | Admitting: Surgery

## 2017-03-19 ENCOUNTER — Telehealth: Payer: Self-pay | Admitting: Cardiovascular Disease

## 2017-03-19 NOTE — Telephone Encounter (Signed)
He is not my patient but I did his heart cath. Was admitted by Dr. [name] and Dr. Tresa Endo was his attending on rounding service. From my standpoint he is OK to stay out of work til follow up appt.  Pawel Soules [place] MD, [facility]

## 2017-03-19 NOTE — Telephone Encounter (Signed)
New message    Pt is calling to ask Dr. Tresa Endo when would be a good time for him to go back to work since he has on a life vest. 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

## 2017-03-19 NOTE — Telephone Encounter (Signed)
Spoke with pt notified that he needs to be off work until appt scheduled 03-26-17 @2pm  with Wynema Birch

## 2017-03-19 NOTE — Telephone Encounter (Signed)
New message     Pt needs a release for work , he needs a call back today if possible , hospital said that you would call him , I offered follow up appt with APP, he said no wants call back from Dr

## 2017-03-19 NOTE — Telephone Encounter (Signed)
HF Nurse Navigator Follow-up Telephone Call I spoke with patient regarding his new diagnosis of HF and recent hospitalization.  He says that he is doing well since getting home.  He does tell me that he does not yet have a hospital follow-up appt and I do not see one noted.  He is also curious as to when he will be able to return to work.  I see that he has called Dr Landry Dyke office and he is awaiting a return call.  He tells me that he is weighing each day --weigh today was 341.4 lbs ( with  life vest) versus discharge weight of 343 lbs.  He says that he is trying to be careful with salt and sodium -watching what he eats.  He would be an appropriate referral to the [facility] for outpatient follow-up and for ongoing financial needs(no insurance noted).  I have asked him to call me back with any concerns or questions related to his HF or if he does not get an appt quickly.

## 2017-03-19 NOTE — Telephone Encounter (Signed)
Line busy when dialed.   Unsure if recommendations discussed at hospital following his procedure, pt has not yet followed up in office. Routed to Dr. [place] for any advice.

## 2017-03-20 ENCOUNTER — Telehealth: Payer: Self-pay | Admitting: Cardiovascular Disease

## 2017-03-20 NOTE — Telephone Encounter (Signed)
Returned the call to the patient. He wanted to know if his Medicaid had gone through yet. He was informed that it was still pending. He stated that he had no way to pay for the appointment if medicaid did not go through. A number for financial assistance was given to the patient to see if they could help. He verbalized his understanding.

## 2017-03-20 NOTE — Telephone Encounter (Signed)
New Message     Pt has some forms that needs to be filled out , can he bring them on 03/26/17 to have them filled out ??

## 2017-03-26 ENCOUNTER — Ambulatory Visit (INDEPENDENT_AMBULATORY_CARE_PROVIDER_SITE_OTHER): Payer: Self-pay | Admitting: Physician Assistant

## 2017-03-26 ENCOUNTER — Encounter: Payer: Self-pay | Admitting: Physician Assistant

## 2017-03-26 VITALS — BP 144/97 | HR 84 | Ht 69.0 in | Wt 344.8 lb

## 2017-03-26 DIAGNOSIS — N182 Chronic kidney disease, stage 2 (mild): Secondary | ICD-10-CM

## 2017-03-26 DIAGNOSIS — I10 Essential (primary) hypertension: Secondary | ICD-10-CM

## 2017-03-26 DIAGNOSIS — I428 Other cardiomyopathies: Secondary | ICD-10-CM

## 2017-03-26 DIAGNOSIS — I5042 Chronic combined systolic (congestive) and diastolic (congestive) heart failure: Secondary | ICD-10-CM

## 2017-03-26 NOTE — Patient Instructions (Addendum)
Stop Losartan   Start Entresto 49/51 mg twice a day   Schedule appointment with pharmacy in 2 weeks for Entresto titration   Schedule Echo the end of December    Your physician recommends that you schedule a follow-up appointment after Echo with Dr.Skains    Schedule Sleep Study when Medicaid approved

## 2017-03-26 NOTE — Progress Notes (Signed)
Cardiology Office Note    Date:  03/27/2017   ID:  Richard Cochran, DOB [birth year], MRN [id number]  PCP:  System, Pcp Not In  Cardiologist:  Dr. [name]   Chief Complaint  Patient presents with  . Hospitalization Follow-up    seen for Dr. [name]    History of Present Illness:  Richard Cochran is a 31 y.o. male with PMH of HTN, CKD stage II, super morbid obesity and recently diagnosed NICM. He was transferred from [facility] to [facility] on 03/13/2017 with NSTEMI. There was no prior history of CAD. No known history of hyperlipidemia or diabetes. Initial troponin was 2.85. Repeat troponin went up to 9.02, and eventually peaked at 14.77. He eventually underwent cardiac cath on 03/14/2017 with EF 15-20%, normal coronaries, moderately elevated LVEDP. It was recommended to place patient on aggressive medical therapy for NICM. Echocardiogram obtained on the same day showed EF 10-15%, grade 1 DD, severe LVH. He also had elevated d-dimer at OSH, V/Q scan was obtained on 03/15/2017 that showed normal perfusion. He was placed on carvedilol, lisinopril, spironolactone as well. He will need a sleep study as outpatient for evaluation of OSA. He was eventually discharged on 03/17/2017 with dry weight of 341 pounds. Dr. Tresa Endo felt the patient should be discharged on lifevest. This was placed on the patient prior to discharge.  He presents today along with his partner. He denies any significant chest discomfort or shortness of breath. His blood pressure continued to be elevated. I will switch him to 49/51 mg Entresto, I will discontinue his losartan. He will return in 2 weeks for up titration of Entresto. Once his blood pressure medication and heart failure medication is optimized. I plan for repeat echocardiogram near the end of December or early January before his next visit with Dr. [name]. He is currently self-pay, he will need to be enrolled in medication assistance program for [facility]. I have  discussed this with our clinical pharmacist. Otherwise he denies any lower extremity edema, orthopnea or PND episodes. If repeat echocardiogram continued to show EF less than 35%, he will need to be reassessed for ICD. Otherwise, I have cleared him to return to work next Monday 9/17 with weight restriction of no more than 10 pounds for 2 weeks.   He was originally implanted to do cardiac MRI in the hospital for evaluation of infiltrative disease, this was deferred as he was unable to be fitted EF MRI machine, I will check with Dr. [name].   Past Medical History:  Diagnosis Date  . Childhood asthma   . CKD (chronic kidney disease)    "left kidney weaker than right" (03/13/2017)  . Hypertension   . NSTEMI (non-ST elevated myocardial infarction) (HCC) 03/13/2017   [name] 03/13/2017  . Renal insufficiency     Past Surgical History:  Procedure Laterality Date  . LEFT HEART CATH AND CORONARY ANGIOGRAPHY N/A 03/14/2017   Procedure: LEFT HEART CATH AND CORONARY ANGIOGRAPHY;  Surgeon: [place], Peter M, MD;  Location: [facility] INVASIVE CV LAB;  Service: Cardiovascular;  Laterality: N/A;  . NO PAST SURGERIES      Current Medications: Outpatient Medications Prior to Visit  Medication Sig Dispense Refill  . acetaminophen (TYLENOL) 325 MG tablet Take 2 tablets (650 mg total) by mouth every 4 (four) hours as needed for headache or mild pain.    Marland Kitchen aspirin EC 81 MG tablet Take 81 mg by mouth daily.    Marland Kitchen atorvastatin (LIPITOR) 20 MG tablet Take 1 tablet (  20 mg total) by mouth daily at 6 PM. 90 tablet 3  . carvedilol (COREG) 12.5 MG tablet Take 1 tablet (12.5 mg total) by mouth 2 (two) times daily with a meal. 180 tablet 3  . spironolactone (ALDACTONE) 25 MG tablet Take 0.5 tablets (12.5 mg total) by mouth daily. 90 tablet 3  . losartan (COZAAR) 25 MG tablet Take 1 tablet (25 mg total) by mouth 2 times daily at 12 noon and 4 pm. 180 tablet 3   No facility-administered medications prior to visit.       Allergies:   Patient has no known allergies.   Social History   Social History  . Marital status: Single    Spouse name: N/A  . Number of children: N/A  . Years of education: N/A   Social History Main Topics  . Smoking status: Former Smoker    Years: 2.00    Types: Cigarettes  . Smokeless tobacco: Never Used     Comment: 03/13/2017 "quit when I was 11 or 12"  . Alcohol use Yes     Comment: 03/13/2017 "I drink on my birthday; nothing else"  . Drug use: No  . Sexual activity: Not Asked   Other Topics Concern  . None   Social History Narrative  . None     Family History:  The patient's family history includes Hypertension in his father and mother.   ROS:   Please see the history of present illness.    ROS All other systems reviewed and are negative.   PHYSICAL EXAM:   VS:  BP (!) 144/97   Pulse 84   Ht  (1.753 m)   Wt (!) 344 lb 12.8 oz (156.4 kg)   BMI 50.92 kg/m    GEN: Well nourished, well developed, in no acute distress  HEENT: normal  Neck: no JVD, carotid bruits, or masses Cardiac: RRR; no murmurs, rubs, or gallops,no edema  Respiratory:  clear to auscultation bilaterally, normal work of breathing GI: soft, nontender, nondistended, + BS MS: no deformity or atrophy  Skin: warm and dry, no rash Neuro:  Alert and Oriented x 3, Strength and sensation are intact Psych: euthymic mood, full affect  Wt Readings from Last 3 Encounters:  03/26/17 (!) 344 lb 12.8 oz (156.4 kg)  03/17/17 (!) 341 lb 14.4 oz (155.1 kg)      Studies/Labs Reviewed:   EKG:  EKG is not ordered today.   Recent Labs: 03/15/2017: TSH 2.034 03/17/2017: B Natriuretic Peptide 30.0; BUN 25; Creatinine, Ser 1.75; Hemoglobin 15.4; Platelets 234; Potassium 4.0; Sodium 138   Lipid Panel    Component Value Date/Time   CHOL 175 03/14/2017 0214   TRIG 88 03/14/2017 0214   HDL 53 03/14/2017 0214   CHOLHDL 3.3 03/14/2017 0214   VLDL 18 03/14/2017 0214   LDLCALC 104 (H) 03/14/2017 0214     Additional studies/ records that were reviewed today include:   Cath 03/14/2017 Conclusion     There is severe left ventricular systolic dysfunction.  LV end diastolic pressure is moderately elevated.  The left ventricular ejection fraction is less than 25% by visual estimate.   1. Normal coronary anatomy 2. Marked LV enlargement with severe global hypokinesis and overall EF 15-20%. 3. Moderately elevated LVEDP  Plan: aggressive therapy for CHF     Echo 03/14/2017  - Left ventricle: The cavity size was severely dilated. There was   severe concentric hypertrophy. Systolic function was normal. The   estimated ejection fraction  was in the range of 10% to 15%.   Diffuse hypokinesis. Doppler parameters are consistent with   abnormal left ventricular relaxation (grade 1 diastolic   dysfunction). - Aortic valve: Transvalvular velocity was within the normal range.   There was no stenosis. There was no regurgitation. - Mitral valve: Transvalvular velocity was within the normal range.   There was no evidence for stenosis. There was trivial   regurgitation. - Left atrium: The atrium was moderately dilated. - Right ventricle: The cavity size was normal. Wall thickness was   normal. Systolic function was normal. - Atrial septum: No defect or patent foramen ovale was identified. - Tricuspid valve: There was no regurgitation.    VQ scan 03/15/2017  FINDINGS: Ventilation: Enlargement of cardiac silhouette. Diminished ventilation in lingula. Small amount of swallowed aerosol within stomach.  Perfusion: Enlargement cardiac silhouette.  No perfusion defects.  IMPRESSION: Enlargement of cardiac silhouette.  Otherwise normal perfusion lung scan.  Mild diminished ventilation in lingula.    ASSESSMENT:    1. Nonischemic cardiomyopathy (HCC)   2. Essential hypertension   3. Chronic combined systolic and diastolic CHF (congestive heart failure) (HCC)   4. CKD  (chronic kidney disease), stage II      PLAN:  In order of problems listed above:  1. NICM: Most likely culprit is years of uncontrolled high blood pressure. He says every time he went to the doctor's office, his blood pressure typically running in the 200 range. Cardiac MRI was recommended in the hospital to rule out infiltrative disease, however patient is too big to fit in the scanner. I will discuss with Dr. [name] to see if he would still recommend a cardiac MRI. For the time being, I did discuss with our clinical pharmacist, even with his self-pay status and the final financial restraint, we think Entresto probably has higher probability of returning his ejection fraction and avoid ICD. He will need a medication assistance program. I have discontinued his losartan and the switched him to 49/51 mg Entresto. We have given him samples for today. He will return in 2 weeks for medication titration. He will need a basic metabolic panel in 5-7 at local lab, we will arrange. Once he is medications 40 titrated, we plan for repeat echocardiogram near the end of December, followed by cardiology visit with Dr. [name] to decide whether or not he will need ICD placement. Prior to that, we will need to make sure his systolic blood pressure is less than 130 mmHg.  2. Hypertension: He will need outpatient sleep study once he is approved for Medicaid. For now we will aggressively control his blood pressure. He has been started on carvedilol, losartan, and the spironolactone in the hospital. We will switch his losartan to [facility]. He will need of 5-7 days patient metabolic panel at local lab as he lives a hour way. Since his partner is still only caretakers in this case, I will fill out her FLMA form for intermittent time off to take him for his cardiology visit.   3. Chronic combined systolic and diastolic heart failure: Euvolemic on physical exam today.  4. CKD stage II: Discharge creatinine was 1.75, baseline  creatinine 1.25, we will monitor renal function through basic metabolic panel.    Medication Adjustments/Labs and Tests Ordered: Current medicines are reviewed at length with the patient today.  Concerns regarding medicines are outlined above.  Medication changes, Labs and Tests ordered today are listed in the Patient Instructions below. Patient Instructions  Stop Losartan  Start Entresto 49/51 mg twice a day   Schedule appointment with pharmacy in 2 weeks for Entresto titration   Schedule Echo the end of December    Your physician recommends that you schedule a follow-up appointment after Echo with Dr.Skains    Schedule Sleep Study when Medicaid approved     Ramond Dial, [place]  03/27/2017 5:47 AM    [facility] [address] [place], [place], [place]  [address] Phone: [phone]; Fax: [phone]

## 2017-03-27 ENCOUNTER — Telehealth: Payer: Self-pay | Admitting: *Deleted

## 2017-03-27 ENCOUNTER — Encounter: Payer: Self-pay | Admitting: Physician Assistant

## 2017-03-27 ENCOUNTER — Telehealth: Payer: Self-pay | Admitting: Physician Assistant

## 2017-03-27 ENCOUNTER — Ambulatory Visit: Payer: Self-pay | Admitting: Physician Assistant

## 2017-03-27 DIAGNOSIS — Z79899 Other long term (current) drug therapy: Secondary | ICD-10-CM

## 2017-03-27 NOTE — Telephone Encounter (Signed)
Left message to call back  

## 2017-03-27 NOTE — Telephone Encounter (Signed)
Patient states he will have labs drawn at [facility] in [facility].  Lab orders faxed to [facility] in [facility] [phone].

## 2017-03-27 NOTE — Telephone Encounter (Signed)
-----   Message from [place], [place] sent at 03/27/2017  5:40 AM EDT ----- Regarding: BMET in 5-7 days Patient lives a hour away, we need a BMET in the next 2 days and also in 7 days as he was started on [place] on 9/11. Can this be arranged with local lab?  Thanks  Signed, Azalee Course PA Pager: [phone]

## 2017-03-27 NOTE — Telephone Encounter (Signed)
Received call from nurse at [facility] patient has only been seen once and no showed for last visit.   Advised they are unable to draw labs unless patient is coming in to be seen.     Left message for pt to call back

## 2017-03-27 NOTE — Telephone Encounter (Signed)
Spoke to patient who states he can have this drawn at his [facility] in [place].      Called to get fax # to fax lab orders to.   Faxed lab orders (office uses [facility]) to [phone].  Patient aware and verbalized understanding.

## 2017-03-28 ENCOUNTER — Telehealth: Payer: Self-pay | Admitting: Physician Assistant

## 2017-03-28 NOTE — Telephone Encounter (Signed)
Returned call to patient who reports he is going to have to pay for the bloodwork out of pocket and is only going to be able to afford it once.   Reports he goes back to work on Monday and is going to try to get insurance through work.       Spoke to [employer] PA who states if patient only able to pay for one time then he preferred to have labs today.    Patient is due to come back 9/26 to see pharm and we can reevaluate/discuss at that time.   Patient aware and will have labs today.

## 2017-03-28 NOTE — Telephone Encounter (Signed)
New message    Pt is calling asking if Richard Cochran will call back. It's about his lab work.

## 2017-03-29 LAB — BASIC METABOLIC PANEL
BUN / CREAT RATIO: 12 (ref 9–20)
BUN: 19 mg/dL (ref 6–20)
CHLORIDE: 104 mmol/L (ref 96–106)
CO2: 21 mmol/L (ref 20–29)
Calcium: 9.6 mg/dL (ref 8.7–10.2)
Creatinine, Ser: 1.53 mg/dL — ABNORMAL HIGH (ref 0.76–1.27)
GFR calc non Af Amer: 59 mL/min/{1.73_m2} — ABNORMAL LOW (ref >59)
GFR, EST AFRICAN AMERICAN: 69 mL/min/{1.73_m2} (ref >59)
Glucose: 88 mg/dL (ref 65–99)
Potassium: 4.6 mmol/L (ref 3.5–5.2)
Sodium: 141 mmol/L (ref 134–144)

## 2017-03-29 NOTE — Telephone Encounter (Signed)
Kidney function and electrolyte stable.

## 2017-04-01 NOTE — Telephone Encounter (Signed)
Returning Hayley's call from Thursday.

## 2017-04-01 NOTE — Telephone Encounter (Signed)
Returned call to patient.He stated his FMLA paper work was not filled out completely.He will fax paper work back to medical records at fax # [phone].

## 2017-04-10 ENCOUNTER — Encounter: Payer: Self-pay | Admitting: Pharmacist Clinician (PhC)/ Clinical Pharmacy Specialist

## 2017-04-10 ENCOUNTER — Ambulatory Visit (INDEPENDENT_AMBULATORY_CARE_PROVIDER_SITE_OTHER): Payer: Self-pay | Admitting: Pharmacist Clinician (PhC)/ Clinical Pharmacy Specialist

## 2017-04-10 DIAGNOSIS — I5043 Acute on chronic combined systolic (congestive) and diastolic (congestive) heart failure: Secondary | ICD-10-CM

## 2017-04-10 MED ORDER — SACUBITRIL-VALSARTAN 97-103 MG PO TABS: 1.0000 | ORAL_TABLET | Freq: Two times a day (BID) | ORAL | 5 refills | Status: DC

## 2017-04-10 NOTE — Assessment & Plan Note (Signed)
Patient with low EF at 10-15%, now using LifeVest.   Appears to be making a concerted effort to improve his health.  Will increase the Entresto to 97/103 twice daily, and asked that he take morning and evening, to keep the doses closer to 12 hours apart.  He is also to check home BP daily.  He was praised on his abstinence of tobacco and his desire to become more active and lose weight.  He was also encouraged to contact the hospital regarding his application for Medicaid, to be sure that it is going thru.  He was given prescription for Entresto (escribed) and a card for free 30 day supply.  We will see him back in 1 month for follow up and possible further titration of carvedilol

## 2017-04-10 NOTE — Progress Notes (Signed)
04/10/2017 Richard Cochran 1986/06/03 [id number]   HPI:  Richard Cochran is a 31 y.o. male patient of Dr [name], with a PMH below who presents today for hypertension clinic evaluation.  Patient went to ER in [facility] on Aug 29 with central chest pain and was transferred to [facility] with elevated troponin levels and NSTEMI.  Cath showed normal coronary arteries, but an echo showed EF 10-15% and severe LVH.   He was discharged after 4 days with a LifeVest.  In addition to the NSTEMI and NICM, he has CHF, CKD (stage 2), hyperlipidemia, morbid obesity and possible sleep apnea.  It was recommended that he get a sleep study.  Since being discharged he saw Azalee Course and his losartan was switched to [place].  Because he is currently uninsured, he should be able to get free medication from the manufacturer until his Medicaid is approved.   He has been taking the Entresto at noon then again around 6 pm with his dinner.  He takes the carvedilol first dose around 7 am, but because he had always taken the losartan at noon, just started with the Entresto at that time.    Today he has no complaints of SOB, CP or LEE.  He admits to occasional dizziness with sudden positional changes.  Since discharge he has quit smoking and drinking alcohol, and is working to decrease his weight.  He was told that he needs to lose at least 100 pounds.   Today he is down 8 pounds since his visit with Wynema Birch 2 weeks ago.   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; some microwave dinners, but only low sodium   Exercise:  Was told to hold back, to rest heart; currently walking 1 mile around track daily, also works as Administrator , would like to pick up exercise level after next week  Home BP readings:  Has home cuff, has not been using  Intolerances:   nkda  SCr 1.53, ht 69", wt 156.4 kg  CrCl 46.8 (with IBW)  Wt Readings from Last 3 Encounters:  04/10/17 (!) 336 lb (152.4 kg)  03/26/17 (!) 344 lb 12.8 oz (156.4 kg)  03/17/17 (!) 341 lb 14.4 oz (155.1 kg)   BP Readings from Last 3 Encounters:  04/10/17 (!) 140/94  03/26/17 (!) 144/97  03/17/17 133/88   Pulse Readings from Last 3 Encounters:  04/10/17 72  03/26/17 84  03/17/17 94    Current Outpatient Prescriptions  Medication Sig Dispense Refill  . acetaminophen (TYLENOL) 325 MG tablet Take 2 tablets (650 mg total) by mouth every 4 (four) hours as needed for headache or mild pain.    Marland Kitchen aspirin EC 81 MG tablet Take 81 mg by mouth daily.    Marland Kitchen atorvastatin (LIPITOR) 20 MG tablet Take 1 tablet (20 mg total) by mouth daily at 6 PM.  90 tablet 3  . carvedilol (COREG) 12.5 MG tablet Take 1 tablet (12.5 mg total) by mouth 2 (two) times daily with a meal. 180 tablet 3  . sacubitril-valsartan (ENTRESTO) 97-103 MG Take 1 tablet by mouth 2 (two) times daily. 60 tablet 5  . spironolactone (ALDACTONE) 25 MG tablet Take 0.5 tablets (12.5 mg total) by mouth daily. 90 tablet 3   No current facility-administered medications for this visit.     No Known Allergies  Past Medical History:  Diagnosis Date  . Childhood asthma   . CKD (chronic kidney disease)    "left kidney weaker than right" (03/13/2017)  . Hypertension   . NSTEMI (non-ST elevated myocardial infarction) (HCC) 03/13/2017   [name] 03/13/2017  . Renal insufficiency     Blood pressure (!) 140/94, pulse 72, height 5\' 9"  (1.753 m), weight (!) 336 lb  (152.4 kg).  (Right arm 138/92, standing 120/94)  Acute on chronic combined systolic and diastolic CHF (congestive heart failure) (HCC) Patient with low EF at 10-15%, now using LifeVest.   Appears to be making a concerted effort to improve his health.  Will increase the Entresto to 97/103 twice daily, and asked that he take morning and evening, to keep the doses closer to 12 hours apart.  He is also to check home BP daily.  He was praised on his abstinence of tobacco and his desire to become more active and lose weight.  He was also encouraged to contact the hospital regarding his application for Medicaid, to be sure that it is going thru.  He was given prescription for Entresto (escribed) and a card for free 30 day supply.  We will see him back in 1 month for follow up and possible further titration of carvedilol    Phillips Hay PharmD CPP [facility] [address] [place], [place] [address] [phone]

## 2017-04-10 NOTE — Patient Instructions (Signed)
Return for a a follow up appointment in 4 weeks  Your blood pressure today is 140/94  Check your blood pressure at home daily and keep record of the readings.  Take your BP meds as follows:  Increase Entresto to 97/103 mg twice daily - breakfast and dinner  Continue all other medications   Bring all of your meds, your BP cuff and your record of home blood pressures to your next appointment.  Exercise as you're able, try to walk approximately 30 minutes per day.  Keep salt intake to a minimum, especially watch canned and prepared boxed foods.  Eat more fresh fruits and vegetables and fewer canned items.  Avoid eating in fast food restaurants.    HOW TO TAKE YOUR BLOOD PRESSURE: . Rest 5 minutes before taking your blood pressure. .  Don't smoke or drink caffeinated beverages for at least 30 minutes before. . Take your blood pressure before (not after) you eat. . Sit comfortably with your back supported and both feet on the floor (don't cross your legs). . Elevate your arm to heart level on a table or a desk. . Use the proper sized cuff. It should fit smoothly and snugly around your bare upper arm. There should be enough room to slip a fingertip under the cuff. The bottom edge of the cuff should be 1 inch above the crease of the elbow. . Ideally, take 3 measurements at one sitting and record the average.

## 2017-05-02 ENCOUNTER — Telehealth: Payer: Self-pay | Admitting: Cardiology

## 2017-05-02 ENCOUNTER — Other Ambulatory Visit: Payer: Self-pay | Admitting: Pharmacist

## 2017-05-02 ENCOUNTER — Ambulatory Visit (INDEPENDENT_AMBULATORY_CARE_PROVIDER_SITE_OTHER): Payer: Self-pay | Admitting: Pharmacist

## 2017-05-02 VITALS — BP 144/92 | HR 70 | Wt 345.4 lb

## 2017-05-02 DIAGNOSIS — I10 Essential (primary) hypertension: Secondary | ICD-10-CM

## 2017-05-02 LAB — BASIC METABOLIC PANEL
BUN/Creatinine Ratio: 14 (ref 9–20)
BUN: 18 mg/dL (ref 6–20)
CO2: 22 mmol/L (ref 20–29)
CREATININE: 1.28 mg/dL — AB (ref 0.76–1.27)
Calcium: 9.2 mg/dL (ref 8.7–10.2)
Chloride: 105 mmol/L (ref 96–106)
GFR calc Af Amer: 85 mL/min/{1.73_m2} (ref >59)
GFR calc non Af Amer: 74 mL/min/{1.73_m2} (ref >59)
GLUCOSE: 91 mg/dL (ref 65–99)
POTASSIUM: 4.5 mmol/L (ref 3.5–5.2)
SODIUM: 142 mmol/L (ref 134–144)

## 2017-05-02 MED ORDER — SACUBITRIL-VALSARTAN 97-103 MG PO TABS: 1.0000 | ORAL_TABLET | Freq: Two times a day (BID) | ORAL | 11 refills | Status: DC

## 2017-05-02 MED ORDER — CARVEDILOL 25 MG PO TABS: 25.0000 mg | ORAL_TABLET | Freq: Two times a day (BID) | ORAL | 1 refills | Status: DC

## 2017-05-02 NOTE — Patient Instructions (Addendum)
Return for a  follow up appointment in 4 weeks  Your blood pressure today is 144/92 pulse 70  Check your blood pressure at home daily (if able) and keep record of the readings.  Take your BP meds as follows: *INCREASE carvedilol to 25mg  twice daily* (okay to take 2 tablets for 12.5mg  twice daily until all gone) Continue all other medication as previously prescribed  Bring all of your meds, your BP cuff and your record of home blood pressures to your next appointment.  Exercise as you're able, try to walk approximately 30 minutes per day.  Keep salt intake to a minimum, especially watch canned and prepared boxed foods.  Eat more fresh fruits and vegetables and fewer canned items.  Avoid eating in fast food restaurants.    HOW TO TAKE YOUR BLOOD PRESSURE: . Rest 5 minutes before taking your blood pressure. .  Don't smoke or drink caffeinated beverages for at least 30 minutes before. . Take your blood pressure before (not after) you eat. . Sit comfortably with your back supported and both feet on the floor (don't cross your legs). . Elevate your arm to heart level on a table or a desk. . Use the proper sized cuff. It should fit smoothly and snugly around your bare upper arm. There should be enough room to slip a fingertip under the cuff. The bottom edge of the cuff should be 1 inch above the crease of the elbow. . Ideally, take 3 measurements at one sitting and record the average.

## 2017-05-02 NOTE — Assessment & Plan Note (Signed)
Blood pressure remains slightly above goal today. Patient tolerating current medication therapy and no ADR reported. Noted some weight gain in absence of edema or shortness of breath. Will repeat BMET today, increase carvedilol to 25mg  twice daily, and continue all other medication as previously prescribed. Patient to continue daily BP monitoring and bring records to f/u appointment in 4 weeks. Plan to increase spironolactone during next appointment if BMET remains stable and additional BP control needed.

## 2017-05-02 NOTE — Telephone Encounter (Signed)
New Message  Pt call requesting to speak with Pharmacist  about appt on today. Please call back to discuss

## 2017-05-02 NOTE — Addendum Note (Signed)
Addended by: Pearletha Furl on: 05/02/2017 12:20 PM   Modules accepted: Orders

## 2017-05-02 NOTE — Telephone Encounter (Signed)
Rx faxed to provided number [phone]

## 2017-05-02 NOTE — Telephone Encounter (Signed)
Patient needs Rx for [facility] fax to [employer]

## 2017-05-02 NOTE — Progress Notes (Signed)
Patient ID: Lajavion Dronen                 DOB: [birth year]                      MRN: [id number]     HPI: Jiovani Hittner is a 31 y.o. male patient of Dr [name], with a PMH below who presents today for hypertension clinic follow up.  Patient went to ER in [facility] on Aug 29 with central chest pain and was transferred to [facility] with elevated troponin levels and NSTEMI.  Cath showed normal coronary arteries, but an echo showed EF 10-15% and severe LVH.   He was discharged after 4 days with a LifeVest.  In addition to the NSTEMI and NICM, he has CHF, CKD (stage 2), hyperlipidemia, morbid obesity and possible sleep apnea. Recommended sleep study still pending.  Patient denies increase dizziness, fatigue, chest pain, shortness of breath or swelling  Since discharge he has quit smoking and alcohol.  He was told that he needs to lose at least 100 pounds but noted wt gain of 9 lbs in 1 month.  Patient did not complete BMET 2 weeks after Entresto titration due to financial difficulties. He agreed on blood draw today during appointment.   Blood Pressure Goal:  130/80  Current Medications:             entresto 97/103mg  twice daily             Carvedilol 12.5 mg twice daily             Spironolactone 12.5 mg daily  Family Hx:             Mother 45 with pacemaker, hypertension             MGM died in her 31's from heart issues             MGF died from stroke in his 20's              Brother only 72 with hypertension             2 sisters w/o problems   Social Hx:             No tobacco, quit August 29; no alcohol since same date  Diet:             No added salt (monitors to keep < 2000 mg/day) ; drinks unsweet tea and water;  Takes own lunch for work, Airline pilot, 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; some microwave dinners, but only low sodium   Exercise:             Was told to hold back, to rest heart; currently walking 1 mile around track daily, also works as Administrator , 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 Childhood asthma   . CKD (chronic kidney disease)    "left kidney weaker than right" (03/13/2017)  . Hypertension   . NSTEMI (non-ST elevated myocardial infarction) (HCC) 03/13/2017   [name]/notes 03/13/2017  . Renal insufficiency     Current Outpatient Prescriptions on File Prior to Visit  Medication Sig Dispense Refill  . acetaminophen (TYLENOL) 325 MG tablet Take 2 tablets (650 mg total) by mouth every 4 (four) hours as needed for headache or mild pain.    Marland Kitchen. aspirin EC 81 MG tablet Take 81 mg by mouth daily.    Marland Kitchen. atorvastatin (LIPITOR) 20 MG tablet Take 1 tablet (20 mg total) by mouth daily at 6 PM. 90 tablet 3  . sacubitril-valsartan (ENTRESTO) 97-103 MG Take 1 tablet by mouth 2 (two) times daily. 60 tablet 5  . spironolactone (ALDACTONE) 25 MG tablet Take 0.5 tablets (12.5 mg total) by mouth daily. 90 tablet 3   No current facility-administered medications on file prior to visit.     No Known Allergies  Blood pressure (!) 144/92, pulse 70, weight (!) 345 lb 6.4 oz (156.7 kg), SpO2 97 %.  Essential hypertension Blood pressure remains slightly above goal today. Patient tolerating current medication therapy and no ADR reported. Noted some weight gain in absence of edema or shortness of breath. Will repeat BMET today, increase carvedilol to 25mg  twice daily, and continue all other medication as previously prescribed.  Patient to continue daily BP monitoring and bring records to f/u appointment in 4 weeks. Plan to increase spironolactone during next appointment if BMET remains stable and additional BP control needed.    Nikan Ellingson Rodriguez-Guzman PharmD, BCPS, CPP [facility] [address] [place] [phone] 05/02/2017 11:07 AM

## 2017-05-03 ENCOUNTER — Telehealth: Payer: Self-pay | Admitting: Cardiology

## 2017-05-03 NOTE — Telephone Encounter (Signed)
New message    Pt said he needs a prescription for his entresto medication sent to the company so he can get his medication for free. He said he dropped some papers off yesterday.

## 2017-05-06 ENCOUNTER — Encounter (HOSPITAL_BASED_OUTPATIENT_CLINIC_OR_DEPARTMENT_OTHER): Payer: Self-pay

## 2017-05-06 NOTE — Telephone Encounter (Signed)
Dr [name] has been out of the office for several weeks now and is back today.  He has the forms (completed by our patient assistance team) and should be getting it signed this afternoon.

## 2017-05-06 NOTE — Telephone Encounter (Signed)
Prescription as well as patient assistance forms faxed to Dr [name] office [phone]) on October 18 at 1pm. Need Dr [name] signature before faxing to company.  Do you need me to fax Rx and form again?

## 2017-05-06 NOTE — Telephone Encounter (Signed)
paperwork completed and taken to Pt Assistance Program teamroom to be faxed/completed.

## 2017-05-07 NOTE — Telephone Encounter (Signed)
**Note De-Identified Richard Cochran Obfuscation** I have faxed the pts pt assistance application and all needed documents to [employer]. I did receive confirmation that the fax was successful.

## 2017-05-10 NOTE — Telephone Encounter (Signed)
**Note De-Identified [name] Obfuscation** Approval received Richard Cochran fax from [employer]. Approval on Entresto good until 05/09/2018.

## 2017-05-25 ENCOUNTER — Encounter (HOSPITAL_BASED_OUTPATIENT_CLINIC_OR_DEPARTMENT_OTHER): Payer: Self-pay

## 2017-05-30 ENCOUNTER — Ambulatory Visit (INDEPENDENT_AMBULATORY_CARE_PROVIDER_SITE_OTHER): Payer: Self-pay | Admitting: Pharmacist

## 2017-05-30 VITALS — BP 136/90 | HR 74 | Wt 353.0 lb

## 2017-05-30 DIAGNOSIS — I10 Essential (primary) hypertension: Secondary | ICD-10-CM

## 2017-05-30 MED ORDER — CARVEDILOL 25 MG PO TABS: 37.5000 mg | ORAL_TABLET | Freq: Two times a day (BID) | ORAL | 1 refills | Status: DC

## 2017-05-30 NOTE — Progress Notes (Signed)
Patient ID: Richard Cochran                 DOB: [birth year]                      MRN: [id number]     HPI: Richard Cochran is a 31 y.o. male patient of Dr Richard Cochran, with a PMH below who presents today for hypertension clinic follow up.  PMH includes NSTEMI, CHF with EF 10-15% and severe LVH in Aug/2018 , CKD (stage 2), hyperlipidemia, morbid obesity and possible sleep apnea. Patient denies increase dizziness, fatigue, chest pain, shortness of breath or swelling.  Since discharge he has quit smoking and alcohol.  He was told that he needs to lose at least 100 pounds but noted wt gain of 15 lbs in 2 month. BMET improved since Entresto and spironolactone were initiated. Noted Scr improved from 1.75 on 03/17/2017 to 1.28 on 05/02/2017.   Blood Pressure Goal:  130/80  Current Medications:             Entresto 97/103mg  twice daily             Carvedilol 37.5mg  twice daily             Spironolactone 12.5 mg daily  Family History:             Mother 63 with pacemaker, hypertension             MGM died in her 32's from heart issues             MGF died from stroke in his 39's              Brother only 29 with hypertension             2 sisters w/o problems   Social History:             No tobacco, quit August 29; no alcohol since same date  Diet:             No added salt (monitors to keep < 2000 mg/day) ; drinks unsweet tea and water;  Takes own lunch for work, Airline pilot, Physicist, 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; some microwave dinners, but only low sodium   Exercise:             Was told to hold back, to rest heart; currently walking 1 mile around track daily, also works as Administrator , 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 Childhood asthma   . CKD (chronic kidney disease)    "left kidney weaker than right" (03/13/2017)  . Hypertension   . NSTEMI (non-ST elevated myocardial infarction) (HCC) 03/13/2017   Richard Cochran 03/13/2017  . Renal insufficiency     Current Outpatient Medications on File Prior to Visit  Medication Sig Dispense Refill  . acetaminophen (TYLENOL) 325 MG tablet Take 2 tablets (650 mg total) by mouth every 4 (four) hours as needed for headache or mild  pain.    . aspirin EC 81 MG tablet Take 81 mg by mouth daily.    Marland Kitchen. atorvastatin (LIPITOR) 20 MG tablet Take 1 tablet (20 mg total) by mouth daily at 6 PM. 90 tablet 3  . sacubitril-valsartan (ENTRESTO) 97-103 MG Take 1 tablet by mouth 2 (two) times daily. 60 tablet 11  . spironolactone (ALDACTONE) 25 MG tablet Take 0.5 tablets (12.5 mg total) by mouth daily. 90 tablet 3   No current facility-administered medications on file prior to visit.     No Known Allergies  Blood pressure 136/90, pulse 74, weight (!) 353 lb (160.1 kg).  Essential hypertension Blood pressure slightly improved but remains above desired goal of < 130/80. Patient reports home BP readings ~ 130s systolic and 86-90 diastolic but no records were provided for reassessment. Richard Cochran denies dizziness, swelling or shortness of breath but noted persistent wight gain. His HR remains appropriate for BB titration as well. Will increase carvedilol dose to 37.5mg  twice daily, work on [facility] [facility] eating plan and control caloric intake. Patient is to monitor BP twice daily and bring records to follow up appointment in 4 weeks.   Richard Cochran PharmD, BCPS, CPP [facility] [address] [place] [phone] 05/30/2017 11:40 AM

## 2017-05-30 NOTE — Patient Instructions (Addendum)
Return for a a follow up appointment in 4 weeks  Your blood pressure today is 136/90 pulse 74  Check your blood pressure at home daily (if able) and keep record of the readings.  Take your BP meds as follows: **INCREASE carvedilol to 37.5mg  twice daily** Continue all other medication as previously prescribed  Bring your BP cuff and your record of home blood pressures to your next appointment.  Exercise as you're able, try to walk approximately 30 minutes per day.  Keep salt intake to a minimum, especially watch canned and prepared boxed foods.  Eat more fresh fruits and vegetables and fewer canned items.  Avoid eating in fast food restaurants.    HOW TO TAKE YOUR BLOOD PRESSURE: . Rest 5 minutes before taking your blood pressure. .  Don't smoke or drink caffeinated beverages for at least 30 minutes before. . Take your blood pressure before (not after) you eat. . Sit comfortably with your back supported and both feet on the floor (don't cross your legs). . Elevate your arm to heart level on a table or a desk. . Use the proper sized cuff. It should fit smoothly and snugly around your bare upper arm. There should be enough room to slip a fingertip under the cuff. The bottom edge of the cuff should be 1 inch above the crease of the elbow. . Ideally, take 3 measurements at one sitting and record the average.    DASH Eating Plan DASH stands for "Dietary Approaches to Stop Hypertension." The DASH eating plan is a healthy eating plan that has been shown to reduce high blood pressure (hypertension). It may also reduce your risk for type 2 diabetes, heart disease, and stroke. The DASH eating plan may also help with weight loss. What are tips for following this plan? General guidelines  Avoid eating more than 2,300 mg (milligrams) of salt (sodium) a day. If you have hypertension, you may need to reduce your sodium intake to 1,500 mg a day.  Limit alcohol intake to no more than 1 drink a day for  nonpregnant women and 2 drinks a day for men. One drink equals 12 oz of beer, 5 oz of wine, or 1 oz of hard liquor.  Work with your health care provider to maintain a healthy body weight or to lose weight. Ask what an ideal weight is for you.  Get at least 30 minutes of exercise that causes your heart to beat faster (aerobic exercise) most days of the week. Activities may include walking, swimming, or biking.  Work with your health care provider or diet and nutrition specialist (dietitian) to adjust your eating plan to your individual calorie needs. Reading food labels  Check food labels for the amount of sodium per serving. Choose foods with less than 5 percent of the Daily Value of sodium. Generally, foods with less than 300 mg of sodium per serving fit into this eating plan.  To find whole grains, look for the word "whole" as the first word in the ingredient list. Shopping  Buy products labeled as "low-sodium" or "no salt added."  Buy fresh foods. Avoid canned foods and premade or frozen meals. Cooking  Avoid adding salt when cooking. Use salt-free seasonings or herbs instead of table salt or sea salt. Check with your health care provider or pharmacist before using salt substitutes.  Do not fry foods. Cook foods using healthy methods such as baking, boiling, grilling, and broiling instead.  Cook with heart-healthy oils, such as olive, canola,  soybean, or sunflower oil. Meal planning   Eat a balanced diet that includes: ? 5 or more servings of fruits and vegetables each day. At each meal, try to fill half of your plate with fruits and vegetables. ? Up to 6-8 servings of whole grains each day. ? Less than 6 oz of lean meat, poultry, or fish each day. A 3-oz serving of meat is about the same size as a deck of cards. One egg equals 1 oz. ? 2 servings of low-fat dairy each day. ? A serving of nuts, seeds, or beans 5 times each week. ? Heart-healthy fats. Healthy fats called Omega-3  fatty acids are found in foods such as flaxseeds and coldwater fish, like sardines, salmon, and mackerel.  Limit how much you eat of the following: ? Canned or prepackaged foods. ? Food that is high in trans fat, such as fried foods. ? Food that is high in saturated fat, such as fatty meat. ? Sweets, desserts, sugary drinks, and other foods with added sugar. ? Full-fat dairy products.  Do not salt foods before eating.  Try to eat at least 2 vegetarian meals each week.  Eat more home-cooked food and less restaurant, buffet, and fast food.  When eating at a restaurant, ask that your food be prepared with less salt or no salt, if possible. What foods are recommended? The items listed may not be a complete list. Talk with your dietitian about what dietary choices are best for you. Grains Whole-grain or whole-wheat bread. Whole-grain or whole-wheat pasta. Brown rice. Orpah Cobb. Bulgur. Whole-grain and low-sodium cereals. Pita bread. Low-fat, low-sodium crackers. Whole-wheat flour tortillas. Vegetables Fresh or frozen vegetables (raw, steamed, roasted, or grilled). Low-sodium or reduced-sodium tomato and vegetable juice. Low-sodium or reduced-sodium tomato sauce and tomato paste. Low-sodium or reduced-sodium canned vegetables. Fruits All fresh, dried, or frozen fruit. Canned fruit in natural juice (without added sugar). Meat and other protein foods Skinless chicken or [place]. Ground chicken or [place]. Pork with fat trimmed off. Fish and seafood. Egg whites. Dried beans, peas, or lentils. Unsalted nuts, nut butters, and seeds. Unsalted canned beans. Lean cuts of beef with fat trimmed off. Low-sodium, lean deli meat. Dairy Low-fat (1%) or fat-free (skim) milk. Fat-free, low-fat, or reduced-fat cheeses. Nonfat, low-sodium ricotta or cottage cheese. Low-fat or nonfat yogurt. Low-fat, low-sodium cheese. Fats and oils Soft margarine without trans fats. Vegetable oil. Low-fat, reduced-fat, or  light mayonnaise and salad dressings (reduced-sodium). Canola, safflower, olive, soybean, and sunflower oils. Avocado. Seasoning and other foods Herbs. Spices. Seasoning mixes without salt. Unsalted popcorn and pretzels. Fat-free sweets. What foods are not recommended? The items listed may not be a complete list. Talk with your dietitian about what dietary choices are best for you. Grains Baked goods made with fat, such as croissants, muffins, or some breads. Dry pasta or rice meal packs. Vegetables Creamed or fried vegetables. Vegetables in a cheese sauce. Regular canned vegetables (not low-sodium or reduced-sodium). Regular canned tomato sauce and paste (not low-sodium or reduced-sodium). Regular tomato and vegetable juice (not low-sodium or reduced-sodium). Rosita Fire. Olives. Fruits Canned fruit in a light or heavy syrup. Fried fruit. Fruit in cream or butter sauce. Meat and other protein foods Fatty cuts of meat. Ribs. Fried meat. Tomasa Blase. Sausage. Bologna and other processed lunch meats. Salami. Fatback. Hotdogs. Bratwurst. Salted nuts and seeds. Canned beans with added salt. Canned or smoked fish. Whole eggs or egg yolks. Chicken or [place] with skin. Dairy Whole or 2% milk, cream, and half-and-half. Whole  or full-fat cream cheese. Whole-fat or sweetened yogurt. Full-fat cheese. Nondairy creamers. Whipped toppings. Processed cheese and cheese spreads. Fats and oils Butter. Stick margarine. Lard. Shortening. Ghee. Bacon fat. Tropical oils, such as coconut, palm kernel, or palm oil. Seasoning and other foods Salted popcorn and pretzels. Onion salt, garlic salt, seasoned salt, table salt, and sea salt. Worcestershire sauce. Tartar sauce. Barbecue sauce. Teriyaki sauce. Soy sauce, including reduced-sodium. Steak sauce. Canned and packaged gravies. Fish sauce. Oyster sauce. Cocktail sauce. Horseradish that you find on the shelf. Ketchup. Mustard. Meat flavorings and tenderizers. Bouillon cubes. Hot  sauce and Tabasco sauce. Premade or packaged marinades. Premade or packaged taco seasonings. Relishes. Regular salad dressings. Where to find more information:  National Heart, Lung, and Blood Institute: PopSteam.iswww.nhlbi.nih.gov  American Heart Association: www.heart.org Summary  The DASH eating plan is a healthy eating plan that has been shown to reduce high blood pressure (hypertension). It may also reduce your risk for type 2 diabetes, heart disease, and stroke.  With the DASH eating plan, you should limit salt (sodium) intake to 2,300 mg a day. If you have hypertension, you may need to reduce your sodium intake to 1,500 mg a day.  When on the DASH eating plan, aim to eat more fresh fruits and vegetables, whole grains, lean proteins, low-fat dairy, and heart-healthy fats.  Work with your health care provider or diet and nutrition specialist (dietitian) to adjust your eating plan to your individual calorie needs. This information is not intended to replace advice given to you by your health care provider. Make sure you discuss any questions you have with your health care provider. Document Released: 06/21/2011 Document Revised: 06/25/2016 Document Reviewed: 06/25/2016 Elsevier Interactive Patient Education  2017 [employer]

## 2017-05-30 NOTE — Assessment & Plan Note (Signed)
Blood pressure slightly improved but remains above desired goal of < 130/80. Patient reports home BP readings ~ 130s systolic and 86-90 diastolic but no records were provided for reassessment. Richard Cochran denies dizziness, swelling or shortness of breath but noted persistent wight gain. His HR remains appropriate for BB titration as well. Will increase carvedilol dose to 37.5mg  twice daily, work on [employer] eating plan and control caloric intake. Patient is to monitor BP twice daily and bring records to follow up appointment in 4 weeks.

## 2017-06-27 ENCOUNTER — Ambulatory Visit (INDEPENDENT_AMBULATORY_CARE_PROVIDER_SITE_OTHER): Payer: Self-pay | Admitting: Pharmacist

## 2017-06-27 ENCOUNTER — Encounter: Payer: Self-pay | Admitting: Pharmacist

## 2017-06-27 VITALS — BP 134/88 | HR 74 | Wt 354.0 lb

## 2017-06-27 DIAGNOSIS — I5043 Acute on chronic combined systolic (congestive) and diastolic (congestive) heart failure: Secondary | ICD-10-CM

## 2017-06-27 NOTE — Patient Instructions (Addendum)
Return for a  follow up appointment in as needed  Your blood pressure today is 134/88 pulse 74  Check your blood pressure at home daily (if able) and keep record of the readings.  Take your BP meds as follows: *ALL medication as prescribed*  Bring all of your meds, your BP cuff and your record of home blood pressures to your next appointment.  Exercise as you're able, try to walk approximately 30 minutes per day.  Keep salt intake to a minimum, especially watch canned and prepared boxed foods.  Eat more fresh fruits and vegetables and fewer canned items.  Avoid eating in fast food restaurants.    HOW TO TAKE YOUR BLOOD PRESSURE: . Rest 5 minutes before taking your blood pressure. .  Don't smoke or drink caffeinated beverages for at least 30 minutes before. . Take your blood pressure before (not after) you eat. . Sit comfortably with your back supported and both feet on the floor (don't cross your legs). . Elevate your arm to heart level on a table or a desk. . Use the proper sized cuff. It should fit smoothly and snugly around your bare upper arm. There should be enough room to slip a fingertip under the cuff. The bottom edge of the cuff should be 1 inch above the crease of the elbow. . Ideally, take 3 measurements at one sitting and record the average.

## 2017-06-27 NOTE — Progress Notes (Signed)
Patient ID: Richard Cochran                 DOB: 11/23/1985                      MRN: [id number]     HPI: Richard Cochran is a 31 y.o. male patient of Dr Anne FuSkains, with a PMH below who presents today for hypertension clinic follow up.  PMH includes NSTEMI, CHF with EF 10-15% and severe LVH in Aug/2018 , CKD (stage 2), hyperlipidemia, morbid obesity and possible sleep apnea. Noted weight gain of 18lbs since Sep/2018. Patient denies increase dizziness, fatigue, chest pain, shortness of breath or swelling.  Since discharge he has quit smoking and alcohol consumption.  BMET improved since Entresto and spironolactone were initiated with Scr = 1.28 on 05/02/2017.    Blood Pressure Goal:  130/80  Current Medications:             Entresto 97/103mg  twice daily             Carvedilol 37.5mg  twice daily             Spironolactone 12.5 mg daily  Family History:             Mother 5959 with pacemaker, hypertension             MGM died in her 7130's from heart issues             MGF died from stroke in his 7960's              Brother only 6519 with hypertension             2 sisters w/o problems   Social History:             No tobacco, quit August 29; no alcohol since same date  Diet:             No added salt (monitors to keep < 2000 mg/day) ; drinks unsweet tea and water   Exercise:             Was told to hold back, to rest heart; currently walking 3 mile daily, also works as Administratorlandscaper  Home BP readings:  No records available ; got new BP device but is to available to calibrate  Wt Readings from Last 3 Encounters:  06/27/17 (!) 354 lb (160.6 kg)  05/30/17 (!) 353 lb (160.1 kg)  05/02/17 (!) 345 lb 6.4 oz (156.7 kg)   BP Readings from Last 3 Encounters:  06/27/17 134/88  05/30/17 136/90  05/02/17 (!) 144/92   Pulse Readings from Last 3 Encounters:  06/27/17 74  05/30/17 74  05/02/17 70    Past Medical History:  Diagnosis Date  . Childhood asthma   . CKD (chronic kidney disease)    "left kidney weaker than right" (03/13/2017)  . Hypertension   . NSTEMI (non-ST elevated myocardial infarction) (HCC) 03/13/2017   [name]/notes 03/13/2017  . Renal insufficiency     Current Outpatient Medications on File Prior to Visit  Medication Sig Dispense Refill  . acetaminophen (TYLENOL) 325 MG tablet Take 2 tablets (650 mg total) by mouth every 4 (four) hours as needed for headache or mild pain.    Marland Kitchen. aspirin EC 81 MG tablet Take 81 mg by mouth daily.    Marland Kitchen. atorvastatin (LIPITOR) 20 MG tablet Take 1 tablet (20 mg total) by mouth daily at 6 PM. 90 tablet 3  . carvedilol (COREG)  25 MG tablet Take 1.5 tablets (37.5 mg total) 2 (two) times daily with a meal by mouth. 90 tablet 1  . sacubitril-valsartan (ENTRESTO) 97-103 MG Take 1 tablet by mouth 2 (two) times daily. 60 tablet 11  . spironolactone (ALDACTONE) 25 MG tablet Take 0.5 tablets (12.5 mg total) by mouth daily. 90 tablet 3   No current facility-administered medications on file prior to visit.     No Known Allergies  Blood pressure 134/88, pulse 74, weight (!) 354 lb (160.6 kg), SpO2 99 %.  Acute on chronic combined systolic and diastolic CHF (congestive heart failure) (HCC) Blood pressure and heart rate remains appropriate for Entresto and carvedilol current dose. Also noted stable renal function and electrolytes with daily spironolactone. Weight gain noted but not associated with edema or shortness of breath. Encouraged to work on diet and weight loss. Will continue current medication as previously prescribed and follow up with pharmacist clinic as needed.   Chalyn Amescua Rodriguez-Guzman PharmD, BCPS, CPP [facility] [address] [place] [address] 06/27/2017 11:18 AM

## 2017-06-27 NOTE — Assessment & Plan Note (Signed)
Blood pressure and heart rate remains appropriate for Entresto and carvedilol current dose. Also noted stable renal function and electrolytes with daily spironolactone. Weight gain noted but not associated with edema or shortness of breath. Encouraged to work on diet and weight loss. Will continue current medication as previously prescribed and follow up with pharmacist clinic as needed.

## 2017-07-02 ENCOUNTER — Encounter (HOSPITAL_BASED_OUTPATIENT_CLINIC_OR_DEPARTMENT_OTHER): Payer: Self-pay

## 2017-07-12 ENCOUNTER — Other Ambulatory Visit (HOSPITAL_COMMUNITY): Payer: Self-pay

## 2017-07-15 ENCOUNTER — Other Ambulatory Visit: Payer: Self-pay

## 2017-07-15 ENCOUNTER — Ambulatory Visit (HOSPITAL_COMMUNITY): Payer: Self-pay | Attending: Cardiovascular Disease

## 2017-07-15 DIAGNOSIS — I10 Essential (primary) hypertension: Secondary | ICD-10-CM

## 2017-07-15 DIAGNOSIS — I252 Old myocardial infarction: Secondary | ICD-10-CM | POA: Insufficient documentation

## 2017-07-15 DIAGNOSIS — I110 Hypertensive heart disease with heart failure: Secondary | ICD-10-CM | POA: Insufficient documentation

## 2017-07-15 DIAGNOSIS — I517 Cardiomegaly: Secondary | ICD-10-CM | POA: Insufficient documentation

## 2017-07-15 DIAGNOSIS — I5042 Chronic combined systolic (congestive) and diastolic (congestive) heart failure: Secondary | ICD-10-CM | POA: Insufficient documentation

## 2017-07-15 DIAGNOSIS — Z6841 Body Mass Index (BMI) 40.0 and over, adult: Secondary | ICD-10-CM | POA: Insufficient documentation

## 2017-07-19 ENCOUNTER — Ambulatory Visit (INDEPENDENT_AMBULATORY_CARE_PROVIDER_SITE_OTHER): Payer: Self-pay | Admitting: Cardiology

## 2017-07-19 ENCOUNTER — Encounter: Payer: Self-pay | Admitting: *Deleted

## 2017-07-19 ENCOUNTER — Encounter: Payer: Self-pay | Admitting: Cardiology

## 2017-07-19 VITALS — BP 144/102 | HR 74 | Ht 69.0 in | Wt 357.6 lb

## 2017-07-19 DIAGNOSIS — I5042 Chronic combined systolic (congestive) and diastolic (congestive) heart failure: Secondary | ICD-10-CM

## 2017-07-19 DIAGNOSIS — I428 Other cardiomyopathies: Secondary | ICD-10-CM

## 2017-07-19 DIAGNOSIS — I420 Dilated cardiomyopathy: Secondary | ICD-10-CM

## 2017-07-19 NOTE — Progress Notes (Signed)
Cardiology Office Note:    Date:  07/19/2017   ID:  Richard Cochran, DOB [birth year], MRN [id number]  PCP:  System, Pcp Not In  Cardiologist:  Donato Schultz, MD    Referring MD: No ref. provider found     History of Present Illness:    Richard Cochran is a 32 y.o. 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 ECHO 03/14/17 and 07/15/17 showing EF once again of 10-15%. Likely HTN cardiomyopathy.  No shock from LifeVest.  No CAD on cardiac catheterization  Blood pressure much better controlled, 107/85 at home.   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  Past Medical History:  Diagnosis Date  . Childhood asthma   . CKD (chronic kidney disease)    "left kidney weaker than right" (03/13/2017)  . Hypertension   . NSTEMI (non-ST elevated myocardial infarction) (HCC) 03/13/2017   [name] 03/13/2017  . Renal insufficiency     Past Surgical History:  Procedure Laterality Date  . LEFT HEART CATH AND CORONARY ANGIOGRAPHY N/A 03/14/2017   Procedure: LEFT HEART CATH AND CORONARY ANGIOGRAPHY;  Surgeon: Swaziland, Peter M, MD;  Location: [facility] INVASIVE CV LAB;  Service: Cardiovascular;  Laterality: N/A;  . NO PAST SURGERIES      Current Medications: Current Meds  Medication Sig  . acetaminophen (TYLENOL) 325 MG tablet Take 2 tablets (650 mg total) by mouth every 4 (four) hours as needed for headache or mild pain.  Marland Kitchen aspirin EC 81 MG tablet Take 81 mg by mouth daily.  Marland Kitchen atorvastatin (LIPITOR) 20 MG tablet Take 1 tablet (20 mg total) by mouth daily at 6 PM.  . carvedilol (COREG) 25 MG tablet Take 1.5 tablets (37.5 mg total) 2 (two) times daily with a meal by mouth.  . sacubitril-valsartan (ENTRESTO) 97-103 MG Take 1 tablet by mouth 2 (two) times daily.  Marland Kitchen spironolactone (ALDACTONE) 25 MG tablet Take 0.5 tablets (12.5 mg total) by mouth daily.     Allergies:   Patient has no known allergies.   Social History   Socioeconomic History  . Marital status:  Single    Spouse name: None  . Number of children: None  . Years of education: None  . Highest education level: None  Social Needs  . Financial resource strain: None  . Food insecurity - worry: None  . Food insecurity - inability: None  . Transportation needs - medical: None  . Transportation needs - non-medical: None  Occupational History  . None  Tobacco Use  . Smoking status: Former Smoker    Years: 2.00    Types: Cigarettes  . Smokeless tobacco: Never Used  . Tobacco comment: 03/13/2017 "quit when I was 11 or 12"  Substance and Sexual Activity  . Alcohol use: Yes    Comment: 03/13/2017 "I drink on my birthday; nothing else"  . Drug use: No  . Sexual activity: None  Other Topics Concern  . None  Social History Narrative  . None     Family History: The patient's family history includes Hypertension in his father and mother. ROS:   Please see the history of present illness.    No syncope bleeding orthopnea PND chest pain shortness of breath all other systems reviewed and are negative.  EKGs/Labs/Other Studies Reviewed:    The following studies were reviewed today: Echocardiograms, cardiac catheterization, lab work, EKG  EKG: Normal sinus rhythm, LVH, no interventricular conduction   Recent Labs: 03/15/2017: TSH 2.034 03/17/2017: B Natriuretic  Peptide 30.0; Hemoglobin 15.4; Platelets 234 05/02/2017: BUN 18; Creatinine, Ser 1.28; Potassium 4.5; Sodium 142  Recent Lipid Panel    Component Value Date/Time   CHOL 175 03/14/2017 0214   TRIG 88 03/14/2017 0214   HDL 53 03/14/2017 0214   CHOLHDL 3.3 03/14/2017 0214   VLDL 18 03/14/2017 0214   LDLCALC 104 (H) 03/14/2017 0214    Physical Exam:    VS:  BP (!) 144/102   Pulse 74   Ht 5\' 9"  (1.753 m)   Wt (!) 357 lb 9.6 oz (162.2 kg)   SpO2 99%   BMI 52.81 kg/m     Wt Readings from Last 3 Encounters:  07/19/17 (!) 357 lb 9.6 oz (162.2 kg)  06/27/17 (!) 354 lb (160.6 kg)  05/30/17 (!) 353 lb (160.1 kg)      GEN: Obese, Well nourished, well developed in no acute distress HEENT: Normal NECK: No JVD; No carotid bruits LYMPHATICS: No lymphadenopathy CARDIAC: RRR, no murmurs, rubs, gallops RESPIRATORY:  Clear to auscultation without rales, wheezing or rhonchi  ABDOMEN: Soft, non-tender, non-distended MUSCULOSKELETAL:  No edema; No deformity  SKIN: Warm and dry NEUROLOGIC:  Alert and oriented x 3 PSYCHIATRIC:  Normal affect   ASSESSMENT:    1. Nonischemic cardiomyopathy (HCC)   2. Dilated cardiomyopathy (HCC)   3. Chronic combined systolic and diastolic CHF (congestive heart failure) (HCC)    PLAN:    In order of problems listed above:  Dilated cardiomyopathy/nonischemic cardiomyopathy -Likely hypertensive in origin given his many years of elevated blood pressure.  Despite medications over the last few months and normal blood pressure control, his ejection fraction remains low at 10-15%.  We will refer him to electrophysiology for defibrillator.  Narrow complex QRS.  Currently NYHA class II -Continue medications as above.  Appreciate pharmacy assistance with titration of medications.  Excellent. He may return LifeVest.   Morbid obesity -Continue to encourage weight loss  Sleep study pending.   Medication Adjustments/Labs and Tests Ordered: Current medicines are reviewed at length with the patient today.  Concerns regarding medicines are outlined above.  No orders of the defined types were placed in this encounter.  No orders of the defined types were placed in this encounter.   Signed, Donato Schultz, MD  07/19/2017 10:45 AM    [facility]

## 2017-07-19 NOTE — Patient Instructions (Addendum)
Medication Instructions:  The current medical regimen is effective;  continue present plan and medications.  Testing/Procedures: You have been referred to Electrophysiology to discuss the placement of a defibrillator.   Follow-Up: Follow up in 3 months with Richard Cochran.  Follow up in 6 months with Dr. [name].  You will receive a letter in the mail 2 months before you are due.  Please call us when you receive this letter to schedule your follow up appointment.  If you need a refill on your cardiac medications before your next appointment, please call your pharmacy.  Thank you for choosing [facility]!!

## 2017-07-30 ENCOUNTER — Ambulatory Visit (INDEPENDENT_AMBULATORY_CARE_PROVIDER_SITE_OTHER): Payer: Self-pay | Admitting: Internal Medicine

## 2017-07-30 ENCOUNTER — Encounter: Payer: Self-pay | Admitting: Internal Medicine

## 2017-07-30 VITALS — BP 146/118 | HR 76 | Ht 69.0 in | Wt 356.6 lb

## 2017-07-30 DIAGNOSIS — I428 Other cardiomyopathies: Secondary | ICD-10-CM

## 2017-07-30 DIAGNOSIS — I420 Dilated cardiomyopathy: Secondary | ICD-10-CM

## 2017-07-30 NOTE — Patient Instructions (Addendum)
Medication Instructions:   Your physician recommends that you continue on your current medications as directed. Please refer to the Current Medication list given to you today.  Labwork:  You will get a BMP and CBC today.   Testing/Procedures: Your physician has recommended that you have a defibrillator inserted. An implantable cardioverter defibrillator (ICD) is a small device that is placed in your chest or, in rare cases, your abdomen. This device uses electrical pulses or shocks to help control life-threatening, irregular heartbeats that could lead the heart to suddenly stop beating (sudden cardiac arrest). Leads are attached to the ICD that goes into your heart. This is done in the hospital and usually requires an overnight stay. Please see the instruction sheet given to you today for more information.   Follow-Up:  You will follow up 10-14 days with the device clinic for a wound check. You will follow up with Dr [name] 91 days after your procedure.   Any Other Special Instructions Will Be Listed Below (If Applicable).  Please arrive at the [facility] main entrance of [facility] at: 8:30 am on Monday August 05, 2017. Do not eat or drink after midnight prior to procedure Use surgical scrub as directed Take all morning medications the morning of procedure with a sip of water, except spironolactone.  Plan for one night stay You will need someone to drive you home at discharge     If you need a refill on your cardiac medications before your next appointment, please call your pharmacy.   Cardioverter Defibrillator Implantation An implantable cardioverter defibrillator (ICD) is a small device that is placed under the skin in the chest or abdomen. An ICD consists of a battery, a small computer (pulse generator), and wires (leads) that go into the heart. An ICD is used to detect and correct two types of dangerous irregular heartbeats (arrhythmias):  A rapid heart rhythm  (tachycardia).  An arrhythmia in which the lower chambers of the heart (ventricles) contract in an uncoordinated way (fibrillation).  When an ICD detects tachycardia, it sends a low-energy shock to the heart to restore the heartbeat to normal (cardioversion). This signal is usually painless. If cardioversion does not work or if the ICD detects fibrillation, it delivers a high-energy shock to the heart (defibrillation) to restart the heart. This shock may feel like a strong jolt in the chest. Your health care provider may prescribe an ICD if:  You have had an arrhythmia that originated in the ventricles.  Your heart has been damaged by a disease or heart condition.  Sometimes, ICDs are programmed to act as a device called a pacemaker. Pacemakers can be used to treat a slow heartbeat (bradycardia) or tachycardia by taking over the heart rate with electrical impulses. Tell a health care provider about:  Any allergies you have.  All medicines you are taking, including vitamins, herbs, eye drops, creams, and over-the-counter medicines.  Any problems you or family members have had with anesthetic medicines.  Any blood disorders you have.  Any surgeries you have had.  Any medical conditions you have.  Whether you are pregnant or may be pregnant. What are the risks? Generally, this is a safe procedure. However, problems may occur, including:  Swelling, bleeding, or bruising.  Infection.  Blood clots.  Damage to other structures or organs, such as nerves, blood vessels, or the heart.  Allergic reactions to medicines used during the procedure.  What happens before the procedure? Staying hydrated Follow instructions from your health  care provider about hydration, which may include:  Up to 2 hours before the procedure - you may continue to drink clear liquids, such as water, clear fruit juice, black coffee, and plain tea.  Eating and drinking restrictions Follow instructions from  your health care provider about eating and drinking, which may include:  8 hours before the procedure - stop eating heavy meals or foods such as meat, fried foods, or fatty foods.  6 hours before the procedure - stop eating light meals or foods, such as toast or cereal.  6 hours before the procedure - stop drinking milk or drinks that contain milk.  2 hours before the procedure - stop drinking clear liquids.  Medicine Ask your health care provider about:  Changing or stopping your normal medicines. This is important if you take diabetes medicines or blood thinners.  Taking medicines such as aspirin and ibuprofen. These medicines can thin your blood. Do not take these medicines before your procedure if your doctor tells you not to.  Tests  You may have blood tests.  You may have a test to check the electrical signals in your heart (electrocardiogram, ECG).  You may have imaging tests, such as a chest X-ray. General instructions  For 24 hours before the procedure, stop using products that contain nicotine or tobacco, such as cigarettes and e-cigarettes. If you need help quitting, ask your health care provider.  Plan to have someone take you home from the hospital or clinic.  You may be asked to shower with a germ-killing soap. What happens during the procedure?  To reduce your risk of infection: ? Your health care team will wash or sanitize their hands. ? Your skin will be washed with soap. ? Hair may be removed from the surgical area.  Small monitors will be put on your body. They will be used to check your heart, blood pressure, and oxygen level.  An IV tube will be inserted into one of your veins.  You will be given one or more of the following: ? A medicine to help you relax (sedative). ? A medicine to numb the area (local anesthetic). ? A medicine to make you fall asleep (general anesthetic).  Leads will be guided through a blood vessel into your heart and attached  to your heart muscles. Depending on the ICD, the leads may go into one ventricle or they may go into both ventricles and into an upper chamber of the heart. An X-ray machine (fluoroscope) will be usedto help guide the leads.  A small incision will be made to create a deep pocket under your skin.  The pulse generator will be placed into the pocket.  The ICD will be tested.  The incision will be closed with stitches (sutures), skin glue, or staples.  A bandage (dressing) will be placed over the incision. This procedure may vary among health care providers and hospitals. What happens after the procedure?  Your blood pressure, heart rate, breathing rate, and blood oxygen level will be monitored often until the medicines you were given have worn off.  A chest X-ray will be taken to check that the ICD is in the right place.  You will need to stay in the hospital for 1-2 days so your health care provider can make sure your ICD is working.  Do not drive for 24 hours if you received a sedative. Ask your health care provider when it is safe for you to drive.  You may be given an identification  card explaining that you have an ICD. Summary  An implantable cardioverter defibrillator (ICD) is a small device that is placed under the skin in the chest or abdomen. It is used to detect and correct dangerous irregular heartbeats (arrhythmias).  An ICD consists of a battery, a small computer (pulse generator), and wires (leads) that go into the heart.  When an ICD detects rapid heart rhythm (tachycardia), it sends a low-energy shock to the heart to restore the heartbeat to normal (cardioversion). If cardioversion does not work or if the ICD detects uncoordinated heart contractions (fibrillation), it delivers a high-energy shock to the heart (defibrillation) to restart the heart.  You will need to stay in the hospital for 1-2 days to make sure your ICD is working. This information is not intended to  replace advice given to you by your health care provider. Make sure you discuss any questions you have with your health care provider. Document Released: 03/24/2002 Document Revised: 07/11/2016 Document Reviewed: 07/11/2016 Elsevier Interactive Patient Education  2017 [employer].

## 2017-07-30 NOTE — H&P (View-Only) (Signed)
HPI Richard Cochran is referred today by Dr. [name] for consideration of ICD insertion. He is a pleasant 32 yo man who was diagnosed with systolic heart failure several months ago. He was placed on a Technical sales engineer. He has a h/o syncope which occurred about a year ago. He denies chest pain or peripheral edema. He has class 2B dyspnea. No other complaints.  No Known Allergies   Current Outpatient Medications  Medication Sig Dispense Refill  . acetaminophen (TYLENOL) 325 MG tablet Take 2 tablets (650 mg total) by mouth every 4 (four) hours as needed for headache or mild pain.    Marland Kitchen aspirin EC 81 MG tablet Take 81 mg by mouth daily.    Marland Kitchen atorvastatin (LIPITOR) 20 MG tablet Take 1 tablet (20 mg total) by mouth daily at 6 PM. 90 tablet 3  . carvedilol (COREG) 25 MG tablet Take 1.5 tablets (37.5 mg total) 2 (two) times daily with a meal by mouth. 90 tablet 1  . sacubitril-valsartan (ENTRESTO) 97-103 MG Take 1 tablet by mouth 2 (two) times daily. 60 tablet 11  . spironolactone (ALDACTONE) 25 MG tablet Take 0.5 tablets (12.5 mg total) by mouth daily. 90 tablet 3   No current facility-administered medications for this visit.      Past Medical History:  Diagnosis Date  . Childhood asthma   . CKD (chronic kidney disease)    "left kidney weaker than right" (03/13/2017)  . Hypertension   . NSTEMI (non-ST elevated myocardial infarction) (HCC) 03/13/2017   [name] 03/13/2017  . Renal insufficiency     ROS:   All systems reviewed and negative except as noted in the HPI.   Past Surgical History:  Procedure Laterality Date  . LEFT HEART CATH AND CORONARY ANGIOGRAPHY N/A 03/14/2017   Procedure: LEFT HEART CATH AND CORONARY ANGIOGRAPHY;  Surgeon: Swaziland, Peter M, MD;  Location[facility] INVASIVE CV LAB;  Service: Cardiovascular;  Laterality: N/A;  . NO PAST SURGERIES       Family History  Problem Relation Age of Onset  . Hypertension Mother   . Hypertension Father      Social History    Socioeconomic History  . Marital status: Single    Spouse name: Not on file  . Number of children: Not on file  . Years of education: Not on file  . Highest education level: Not on file  Social Needs  . Financial resource strain: Not on file  . Food insecurity - worry: Not on file  . Food insecurity - inability: Not on file  . Transportation needs - medical: Not on file  . Transportation needs - non-medical: Not on file  Occupational History  . Not on file  Tobacco Use  . Smoking status: Former Smoker    Years: 2.00    Types: Cigarettes  . Smokeless tobacco: Never Used  . Tobacco comment: 03/13/2017 "quit when I was 11 or 12"  Substance and Sexual Activity  . Alcohol use: Yes    Comment: 03/13/2017 "I drink on my birthday; nothing else"  . Drug use: No  . Sexual activity: Not on file  Other Topics Concern  . Not on file  Social History Narrative  . Not on file     BP (!) 146/118   Pulse 76   Ht 5\' 9"  (1.753 m)   Wt (!) 356 lb 9.6 oz (161.8 kg)   BMI 52.66 kg/m   Physical Exam:  Well appearing obese young man, NAD HEENT:  Unremarkable Neck:  7 cm JVD, no thyromegally Lymphatics:  No adenopathy Back:  No CVA tenderness Lungs:  Clear HEART:  Regular rate rhythm, no murmurs, no rubs, no clicks, heart sounds distant. Abd:  Soft, obese, positive bowel sounds, no organomegally, no rebound, no guarding Ext:  2 plus pulses, no edema, no cyanosis, no clubbing Skin:  No rashes no nodules Neuro:  CN II through XII intact, motor grossly intact  EKG - NSR  DEVICE  Normal device function.  See PaceArt for details.   Assess/Plan: 1. Chronic systolic heart failure - I have discussed the treatment options with the patient. He has been on maximal medical therapy and his EF is still 20%. His QRS is narrow. The risks/benefits/goals/expectations of ICD insertion were reviewed and he wishes to proceed. 2. Obesity - we discussed the importance of weight loss.  3. HTN - his blood  pressure is not well controlled today. He is encouraged to reduce his salt intake and to lose weight.  Leonia Reeves.D.

## 2017-07-30 NOTE — Progress Notes (Signed)
HPI Richard Cochran is referred today by Richard Cochran for consideration of ICD insertion. He is a pleasant 32 yo man who was diagnosed with systolic heart failure several months ago. He was placed on a Technical sales engineer. He has a h/o syncope which occurred about a year ago. He denies chest pain or peripheral edema. He has class 2B dyspnea. No other complaints.  No Known Allergies   Current Outpatient Medications  Medication Sig Dispense Refill  . acetaminophen (TYLENOL) 325 MG tablet Take 2 tablets (650 mg total) by mouth every 4 (four) hours as needed for headache or mild pain.    Marland Kitchen aspirin EC 81 MG tablet Take 81 mg by mouth daily.    Marland Kitchen atorvastatin (LIPITOR) 20 MG tablet Take 1 tablet (20 mg total) by mouth daily at 6 PM. 90 tablet 3  . carvedilol (COREG) 25 MG tablet Take 1.5 tablets (37.5 mg total) 2 (two) times daily with a meal by mouth. 90 tablet 1  . sacubitril-valsartan (ENTRESTO) 97-103 MG Take 1 tablet by mouth 2 (two) times daily. 60 tablet 11  . spironolactone (ALDACTONE) 25 MG tablet Take 0.5 tablets (12.5 mg total) by mouth daily. 90 tablet 3   No current facility-administered medications for this visit.      Past Medical History:  Diagnosis Date  . Childhood asthma   . CKD (chronic kidney disease)    "left kidney weaker than right" (03/13/2017)  . Hypertension   . NSTEMI (non-ST elevated myocardial infarction) (HCC) 03/13/2017   Richard Cochran 03/13/2017  . Renal insufficiency     ROS:   All systems reviewed and negative except as noted in the HPI.   Past Surgical History:  Procedure Laterality Date  . LEFT HEART CATH AND CORONARY ANGIOGRAPHY N/A 03/14/2017   Procedure: LEFT HEART CATH AND CORONARY ANGIOGRAPHY;  Surgeon: [place], Peter M, MD;  Location: [facility] INVASIVE CV LAB;  Service: Cardiovascular;  Laterality: N/A;  . NO PAST SURGERIES       Family History  Problem Relation Age of Onset  . Hypertension Mother   . Hypertension Father      Social History    Socioeconomic History  . Marital status: Single    Spouse name: Not on file  . Number of children: Not on file  . Years of education: Not on file  . Highest education level: Not on file  Social Needs  . Financial resource strain: Not on file  . Food insecurity - worry: Not on file  . Food insecurity - inability: Not on file  . Transportation needs - medical: Not on file  . Transportation needs - non-medical: Not on file  Occupational History  . Not on file  Tobacco Use  . Smoking status: Former Smoker    Years: 2.00    Types: Cigarettes  . Smokeless tobacco: Never Used  . Tobacco comment: 03/13/2017 "quit when I was 11 or 12"  Substance and Sexual Activity  . Alcohol use: Yes    Comment: 03/13/2017 "I drink on my birthday; nothing else"  . Drug use: No  . Sexual activity: Not on file  Other Topics Concern  . Not on file  Social History Narrative  . Not on file     BP (!) 146/118   Pulse 76   Ht 5\' 9"  (1.753 Cochran)   Wt (!) 356 lb 9.6 oz (161.8 kg)   BMI 52.66 kg/Cochran   Physical Exam:  Well appearing obese young man, NAD HEENT:  Unremarkable Neck:  7 cm JVD, no thyromegally Lymphatics:  No adenopathy Back:  No CVA tenderness Lungs:  Clear HEART:  Regular rate rhythm, no murmurs, no rubs, no clicks, heart sounds distant. Abd:  Soft, obese, positive bowel sounds, no organomegally, no rebound, no guarding Ext:  2 plus pulses, no edema, no cyanosis, no clubbing Skin:  No rashes no nodules Neuro:  CN II through XII intact, motor grossly intact  EKG - NSR  DEVICE  Normal device function.  See PaceArt for details.   Assess/Plan: 1. Chronic systolic heart failure - I have discussed the treatment options with the patient. He has been on maximal medical therapy and his EF is still 20%. His QRS is narrow. The risks/benefits/goals/expectations of ICD insertion were reviewed and he wishes to proceed. 2. Obesity - we discussed the importance of weight loss.  3. HTN - his blood  pressure is not well controlled today. He is encouraged to reduce his salt intake and to lose weight.  Richard Cochran.D.

## 2017-07-31 LAB — CBC WITH DIFFERENTIAL/PLATELET
BASOS ABS: 0 10*3/uL (ref 0.0–0.2)
BASOS: 0 %
EOS (ABSOLUTE): 0.1 10*3/uL (ref 0.0–0.4)
Eos: 2 %
Hematocrit: 44.7 % (ref 37.5–51.0)
Hemoglobin: 14.1 g/dL (ref 13.0–17.7)
IMMATURE GRANULOCYTES: 0 %
Immature Grans (Abs): 0 10*3/uL (ref 0.0–0.1)
Lymphocytes Absolute: 1.9 10*3/uL (ref 0.7–3.1)
Lymphs: 21 %
MCH: 26.9 pg (ref 26.6–33.0)
MCHC: 31.5 g/dL (ref 31.5–35.7)
MCV: 85 fL (ref 79–97)
MONOS ABS: 0.7 10*3/uL (ref 0.1–0.9)
Monocytes: 7 %
NEUTROS ABS: 6.5 10*3/uL (ref 1.4–7.0)
NEUTROS PCT: 70 %
PLATELETS: 249 10*3/uL (ref 150–379)
RBC: 5.24 x10E6/uL (ref 4.14–5.80)
RDW: 14.2 % (ref 12.3–15.4)
WBC: 9.3 10*3/uL (ref 3.4–10.8)

## 2017-07-31 LAB — BASIC METABOLIC PANEL
BUN/Creatinine Ratio: 13 (ref 9–20)
BUN: 17 mg/dL (ref 6–20)
CHLORIDE: 103 mmol/L (ref 96–106)
CO2: 22 mmol/L (ref 20–29)
Calcium: 9.7 mg/dL (ref 8.7–10.2)
Creatinine, Ser: 1.29 mg/dL — ABNORMAL HIGH (ref 0.76–1.27)
GFR calc Af Amer: 84 mL/min/{1.73_m2} (ref >59)
GFR, EST NON AFRICAN AMERICAN: 73 mL/min/{1.73_m2} (ref >59)
Glucose: 96 mg/dL (ref 65–99)
POTASSIUM: 4.6 mmol/L (ref 3.5–5.2)
SODIUM: 141 mmol/L (ref 134–144)

## 2017-08-01 ENCOUNTER — Telehealth: Payer: Self-pay | Admitting: Internal Medicine

## 2017-08-01 NOTE — Telephone Encounter (Signed)
Returned call to Pt.  Pt concerned about returning to work after ICD placement.  He is a Administrator.  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  Reminded Pt to ask Dr. [name] or APP before his is discharged next Tuesday.  Sent message to Dr. [name] and Luster Landsberg to address with Pt at the hospital.

## 2017-08-01 NOTE — Telephone Encounter (Signed)
Richard Cochran is calling because he has a question about his procedure on Monday . Please call

## 2017-08-05 ENCOUNTER — Ambulatory Visit (HOSPITAL_COMMUNITY)
Admission: RE | Admit: 2017-08-05 | Discharge: 2017-08-06 | Disposition: A | Discharge status: Home / Self Care | Payer: Self-pay | Source: Ambulatory Visit | Attending: Internal Medicine | Admitting: Internal Medicine

## 2017-08-05 ENCOUNTER — Encounter (HOSPITAL_COMMUNITY): Payer: Self-pay | Admitting: Internal Medicine

## 2017-08-05 ENCOUNTER — Ambulatory Visit (HOSPITAL_COMMUNITY)
Admission: RE | Disposition: A | Discharge status: Home / Self Care | Payer: Self-pay | Source: Ambulatory Visit | Attending: Internal Medicine

## 2017-08-05 ENCOUNTER — Other Ambulatory Visit: Payer: Self-pay

## 2017-08-05 DIAGNOSIS — Z79899 Other long term (current) drug therapy: Secondary | ICD-10-CM | POA: Insufficient documentation

## 2017-08-05 DIAGNOSIS — I480 Paroxysmal atrial fibrillation: Secondary | ICD-10-CM | POA: Insufficient documentation

## 2017-08-05 DIAGNOSIS — Z6841 Body Mass Index (BMI) 40.0 and over, adult: Secondary | ICD-10-CM | POA: Insufficient documentation

## 2017-08-05 DIAGNOSIS — Z8249 Family history of ischemic heart disease and other diseases of the circulatory system: Secondary | ICD-10-CM | POA: Insufficient documentation

## 2017-08-05 DIAGNOSIS — I5043 Acute on chronic combined systolic (congestive) and diastolic (congestive) heart failure: Secondary | ICD-10-CM | POA: Diagnosis present

## 2017-08-05 DIAGNOSIS — I5022 Chronic systolic (congestive) heart failure: Secondary | ICD-10-CM | POA: Diagnosis present

## 2017-08-05 DIAGNOSIS — Z7982 Long term (current) use of aspirin: Secondary | ICD-10-CM | POA: Insufficient documentation

## 2017-08-05 DIAGNOSIS — Z006 Encounter for examination for normal comparison and control in clinical research program: Secondary | ICD-10-CM | POA: Insufficient documentation

## 2017-08-05 DIAGNOSIS — I130 Hypertensive heart and chronic kidney disease with heart failure and stage 1 through stage 4 chronic kidney disease, or unspecified chronic kidney disease: Secondary | ICD-10-CM | POA: Insufficient documentation

## 2017-08-05 DIAGNOSIS — Z87891 Personal history of nicotine dependence: Secondary | ICD-10-CM | POA: Insufficient documentation

## 2017-08-05 DIAGNOSIS — I252 Old myocardial infarction: Secondary | ICD-10-CM | POA: Insufficient documentation

## 2017-08-05 DIAGNOSIS — E669 Obesity, unspecified: Secondary | ICD-10-CM | POA: Insufficient documentation

## 2017-08-05 DIAGNOSIS — I428 Other cardiomyopathies: Secondary | ICD-10-CM | POA: Insufficient documentation

## 2017-08-05 DIAGNOSIS — N189 Chronic kidney disease, unspecified: Secondary | ICD-10-CM | POA: Insufficient documentation

## 2017-08-05 DIAGNOSIS — Z95810 Presence of automatic (implantable) cardiac defibrillator: Secondary | ICD-10-CM

## 2017-08-05 HISTORY — PX: ICD IMPLANT: EP1208

## 2017-08-05 LAB — SURGICAL PCR SCREEN
MRSA, PCR: NEGATIVE
Staphylococcus aureus: NEGATIVE

## 2017-08-05 SURGERY — ICD IMPLANT

## 2017-08-05 MED ORDER — MIDAZOLAM HCL 5 MG/5ML IJ SOLN
INTRAMUSCULAR | Status: DC | PRN
  Administered 2017-08-05: 2 mg via INTRAVENOUS
  Administered 2017-08-05 (×4): 1 mg via INTRAVENOUS

## 2017-08-05 MED ORDER — LIDOCAINE HCL 1 % IJ SOLN
INTRAMUSCULAR | Status: AC
  Filled 2017-08-05: qty 40

## 2017-08-05 MED ORDER — HEPARIN (PORCINE) IN NACL 2-0.9 UNIT/ML-% IJ SOLN
INTRAMUSCULAR | Status: AC
  Filled 2017-08-05: qty 500

## 2017-08-05 MED ORDER — CEFAZOLIN SODIUM-DEXTROSE 2-4 GM/100ML-% IV SOLN
INTRAVENOUS | Status: AC
  Filled 2017-08-05: qty 100

## 2017-08-05 MED ORDER — SODIUM CHLORIDE 0.9 % IV SOLN
INTRAVENOUS | Status: DC
  Administered 2017-08-05: 10:00:00 via INTRAVENOUS

## 2017-08-05 MED ORDER — CHLORHEXIDINE GLUCONATE 4 % EX LIQD: 60.0000 mL | Freq: Once | CUTANEOUS | Status: DC

## 2017-08-05 MED ORDER — CEFAZOLIN SODIUM-DEXTROSE 1-4 GM/50ML-% IV SOLN
1.0000 g | Freq: Four times a day (QID) | INTRAVENOUS | Status: AC
  Administered 2017-08-05 – 2017-08-06 (×3): 1 g via INTRAVENOUS
  Filled 2017-08-05 (×3): qty 50

## 2017-08-05 MED ORDER — MIDAZOLAM HCL 5 MG/5ML IJ SOLN
INTRAMUSCULAR | Status: AC
  Filled 2017-08-05: qty 5

## 2017-08-05 MED ORDER — ONDANSETRON HCL 4 MG/2ML IJ SOLN
INTRAMUSCULAR | Status: AC
  Filled 2017-08-05: qty 2

## 2017-08-05 MED ORDER — ATORVASTATIN CALCIUM 20 MG PO TABS
20.0000 mg | ORAL_TABLET | Freq: Every day | ORAL | Status: DC
  Administered 2017-08-05: 20 mg via ORAL
  Filled 2017-08-05: qty 1

## 2017-08-05 MED ORDER — CARVEDILOL 12.5 MG PO TABS
37.5000 mg | ORAL_TABLET | Freq: Two times a day (BID) | ORAL | Status: DC
  Administered 2017-08-05 – 2017-08-06 (×2): 37.5 mg via ORAL
  Filled 2017-08-05 (×2): qty 1

## 2017-08-05 MED ORDER — ACETAMINOPHEN 325 MG PO TABS
325.0000 mg | ORAL_TABLET | ORAL | Status: DC | PRN
  Administered 2017-08-05 – 2017-08-06 (×2): 650 mg via ORAL
  Filled 2017-08-05 (×2): qty 2

## 2017-08-05 MED ORDER — FENTANYL CITRATE (PF) 100 MCG/2ML IJ SOLN
INTRAMUSCULAR | Status: AC
  Filled 2017-08-05: qty 2

## 2017-08-05 MED ORDER — SPIRONOLACTONE 12.5 MG HALF TABLET
12.5000 mg | ORAL_TABLET | Freq: Every day | ORAL | Status: DC
  Administered 2017-08-06: 12.5 mg via ORAL
  Filled 2017-08-05 (×3): qty 1

## 2017-08-05 MED ORDER — DEXTROSE 5 % IV SOLN
3.0000 g | Freq: Once | INTRAVENOUS | Status: AC
  Administered 2017-08-05: 3 g via INTRAVENOUS
  Filled 2017-08-05: qty 3000

## 2017-08-05 MED ORDER — SACUBITRIL-VALSARTAN 97-103 MG PO TABS
1.0000 | ORAL_TABLET | Freq: Two times a day (BID) | ORAL | Status: DC
  Administered 2017-08-05 – 2017-08-06 (×2): 1 via ORAL
  Filled 2017-08-05 (×2): qty 1

## 2017-08-05 MED ORDER — LIDOCAINE HCL 1 % IJ SOLN
INTRAMUSCULAR | Status: AC
  Filled 2017-08-05: qty 20

## 2017-08-05 MED ORDER — ONDANSETRON HCL 4 MG/2ML IJ SOLN: 4.0000 mg | Freq: Four times a day (QID) | INTRAMUSCULAR | Status: DC | PRN

## 2017-08-05 MED ORDER — ONDANSETRON HCL 4 MG/2ML IJ SOLN
INTRAMUSCULAR | Status: DC | PRN
  Administered 2017-08-05: 4 mg via INTRAVENOUS

## 2017-08-05 MED ORDER — SODIUM CHLORIDE 0.9 % IR SOLN
80.0000 mg | Status: AC
  Administered 2017-08-05: 80 mg

## 2017-08-05 MED ORDER — HYDROCODONE-ACETAMINOPHEN 5-325 MG PO TABS
1.0000 | ORAL_TABLET | Freq: Two times a day (BID) | ORAL | Status: DC | PRN
  Administered 2017-08-05: 1 via ORAL
  Filled 2017-08-05: qty 1

## 2017-08-05 MED ORDER — ASPIRIN EC 81 MG PO TBEC
81.0000 mg | DELAYED_RELEASE_TABLET | Freq: Every day | ORAL | Status: DC
  Administered 2017-08-06: 81 mg via ORAL
  Filled 2017-08-05: qty 1

## 2017-08-05 MED ORDER — SODIUM CHLORIDE 0.9 % IR SOLN
Status: AC
  Filled 2017-08-05: qty 2

## 2017-08-05 MED ORDER — LIDOCAINE HCL (PF) 1 % IJ SOLN
INTRAMUSCULAR | Status: DC | PRN
  Administered 2017-08-05: 80 mL

## 2017-08-05 MED ORDER — MUPIROCIN 2 % EX OINT
1.0000 "application " | TOPICAL_OINTMENT | Freq: Once | CUTANEOUS | Status: AC
  Administered 2017-08-05: 1 via TOPICAL

## 2017-08-05 MED ORDER — FENTANYL CITRATE (PF) 100 MCG/2ML IJ SOLN
INTRAMUSCULAR | Status: DC | PRN
  Administered 2017-08-05: 12.5 ug via INTRAVENOUS
  Administered 2017-08-05 (×2): 25 ug via INTRAVENOUS
  Administered 2017-08-05: 12.5 ug via INTRAVENOUS

## 2017-08-05 MED ORDER — MUPIROCIN 2 % EX OINT
TOPICAL_OINTMENT | CUTANEOUS | Status: AC
  Filled 2017-08-05: qty 22

## 2017-08-05 MED ORDER — HEPARIN (PORCINE) IN NACL 2-0.9 UNIT/ML-% IJ SOLN
INTRAMUSCULAR | Status: AC | PRN
  Administered 2017-08-05: 500 mL

## 2017-08-05 MED ORDER — CEFAZOLIN SODIUM-DEXTROSE 2-4 GM/100ML-% IV SOLN: 2.0000 g | INTRAVENOUS | Status: DC

## 2017-08-05 SURGICAL SUPPLY — 7 items
CABLE SURGICAL S-101-97-12 (CABLE) ×3 IMPLANT
HOVERMATT SINGLE USE (MISCELLANEOUS) ×3 IMPLANT
ICD VIGILANT VR D232 (Pacemaker) ×3 IMPLANT
LEAD RELIANCE G DF4 0293 (Lead) ×3 IMPLANT
PAD DEFIB LIFELINK (PAD) ×3 IMPLANT
SHEATH CLASSIC 9F (SHEATH) ×3 IMPLANT
TRAY PACEMAKER INSERTION (PACKS) ×3 IMPLANT

## 2017-08-05 NOTE — Discharge Summary (Signed)
ELECTROPHYSIOLOGY PROCEDURE DISCHARGE SUMMARY    Patient ID: Richard Cochran,  MRN: [id number], DOB/AGE: Jul 30, 1985 32 y.o.  Admit date: 08/05/2017 Discharge date: 08/06/17  Primary Care Physician: System, Pcp Not In Primary Cardiologist: Dr. [name] Electrophysiologist: Dr. [name]  Primary Discharge Diagnosis:  1. DCM 2. New AFib     CHA2DS2Vasc is 2, will start Eliquis  Secondary Discharge Diagnosis:  1. Chronic CHF 2. HTN (for many years per the patient)   No Known Allergies   Procedures This Admission:  1.  Implantation of a BSCi single chamber ICD on 08/05/17 by Dr. [name], he recieved [facility] (serial number [id number]) right ventricular defibrillator lead, [employer] (serial Number [phone]) ICD DFT's were deferred at time of implant.  There were no immediate post procedure complications. 2.  CXR on 08/06/17 demonstrated no pneumothorax status post device implantation.   Brief HPI: Richard Cochran is a 32 y.o. male was referred to electrophysiology in the outpatient setting for consideration of ICD implantation.  Past medical history includes DCM, chronic CHF.  The patient has persistent LV dysfunction despite guideline directed therapy.  Risks, benefits, and alternatives to ICD implantation were reviewed with the patient who wished to proceed.   Hospital Course:  The patient was admitted and underwent implantation of an ICD with details as outlined above. He was monitored on telemetry overnight which demonstrated AFib, generally 80's-90's.  Left chest was without hematoma or ecchymosis.  The device was interrogated and found to be functioning normally.  CXR was obtained and demonstrated no pneumothorax status post device implantation.  The patient arrived for his procedure yesterday in AFib.  No symptoms, and of unknown duration.  Dr. [name] discussed at length importance of stroke risk and reduction with anticoagulation.  We will start Eliquis tomorrow PM dose post implant.   Patient/family at bedside state understanding.  Wound care, arm mobility, and restrictions were reviewed with the patient.  The patient was examined by Dr. [name] and considered stable for discharge to home.   The patient's discharge medications include an ARB (Entresto) and beta blocker (carvedilol).   Physical Exam: Vitals:   08/06/17 0500 08/06/17 0633 08/06/17 0940 08/06/17 0943  BP: 110/69  (!) 118/100 108/77  Pulse: 88  94 100  Resp: 18     Temp: 98 F (36.7 C)     TempSrc: Oral     SpO2: 98%     Weight:  (!) 349 lb (158.3 kg)    Height:        GEN- The patient is well appearing, alert and oriented x 3 today.   HEENT: normocephalic, atraumatic; sclera clear, conjunctiva pink; hearing intact; oropharynx clear Lungs- CTA b/l, normal work of breathing.  No wheezes, rales, rhonchi Heart- iRRR, no murmurs, rubs or gallops, PMI not laterally displaced GI- soft, non-tender, non-distended Extremities- no clubbing, cyanosis, or edema MS- no significant deformity or atrophy Skin- warm and dry, no rash or lesion, left chest without hematoma/ecchymosis Psych- euthymic mood, full affect Neuro- no gross defecits  Labs:   Lab Results  Component Value Date   WBC 9.3 07/30/2017   HGB 14.1 07/30/2017   HCT 44.7 07/30/2017   MCV 85 07/30/2017   PLT 249 07/30/2017    Recent Labs  Lab 07/30/17 1129  NA 141  K 4.6  CL 103  CO2 22  BUN 17  CREATININE 1.29*  CALCIUM 9.7  GLUCOSE 96    Discharge Medications:  Allergies as of 08/06/2017   No Known  Allergies     Medication List    TAKE these medications   acetaminophen 325 MG tablet Commonly known as:  TYLENOL Take 2 tablets (650 mg total) by mouth every 4 (four) hours as needed for headache or mild pain.   apixaban 5 MG Tabs tablet Commonly known as:  ELIQUIS Take 1 tablet (5 mg total) by mouth 2 (two) times daily. Notes to patient:  Start 08/07/17 (Wed.) evening dose   aspirin EC 81 MG tablet Take 81 mg by mouth  daily.   atorvastatin 20 MG tablet Commonly known as:  LIPITOR Take 1 tablet (20 mg total) by mouth daily at 6 PM.   carvedilol 25 MG tablet Commonly known as:  COREG Take 1.5 tablets (37.5 mg total) 2 (two) times daily with a meal by mouth.   sacubitril-valsartan 97-103 MG Commonly known as:  ENTRESTO Take 1 tablet by mouth 2 (two) times daily.   spironolactone 25 MG tablet Commonly known as:  ALDACTONE Take 0.5 tablets (12.5 mg total) by mouth daily.       Disposition:  Home  Discharge Instructions    Diet - low sodium heart healthy   Complete by:  As directed    Increase activity slowly   Complete by:  As directed      Follow-up Information    [facility] Office Follow up on 08/19/2017.   Specialty:  Cardiology Why:  11:00AM, wound check visit Contact information: [address], [address] [place] [address] [phone]       Marinus Maw, MD Follow up on 11/08/2017.   Specialty:  Cardiology Why:  11:00AM Contact information: [address] [place] [address] [phone]        Azalee Course, [place] Follow up on 10/11/2017.   Specialties:  Cardiology, Radiology Why:  10:30AM Contact information: [address] [place] [address] [phone]           Duration of Discharge Encounter: Greater than 30 minutes including physician time.  Norma Fredrickson, PA-C 08/06/2017 10:26 AM   EP Attending  Patient seen and examined. Agree with above. The patient is stable after ICD insertion. His CXR looks good and his ICD incision appears to be stable with no bleeding or hematoma. He will be discharged home. His device has been interogated under my supervision and is working normally. Will recheck in several months.  Leonia Reeves.D.

## 2017-08-05 NOTE — Interval H&P Note (Signed)
History and Physical Interval Note:  08/05/2017 11:35 AM  Richard Cochran  has presented today for surgery, with the diagnosis of dialated cardiomyopathy, hf  The various methods of treatment have been discussed with the patient and family. After consideration of risks, benefits and other options for treatment, the patient has consented to  Procedure(s): ICD IMPLANT (N/A) as a surgical intervention .  The patient's history has been reviewed, patient examined, no change in status, stable for surgery.  I have reviewed the patient's chart and labs.  Questions were answered to the patient's satisfaction.     Lewayne Bunting

## 2017-08-06 ENCOUNTER — Encounter (HOSPITAL_COMMUNITY): Payer: Self-pay | Admitting: *Deleted

## 2017-08-06 ENCOUNTER — Ambulatory Visit (HOSPITAL_COMMUNITY): Payer: Self-pay

## 2017-08-06 DIAGNOSIS — I5043 Acute on chronic combined systolic (congestive) and diastolic (congestive) heart failure: Secondary | ICD-10-CM

## 2017-08-06 MED ORDER — APIXABAN 5 MG PO TABS: 5.0000 mg | ORAL_TABLET | Freq: Two times a day (BID) | ORAL | 6 refills | Status: DC

## 2017-08-06 MED FILL — Gentamicin Sulfate Inj 40 MG/ML: INTRAMUSCULAR | Qty: 80 | Status: AC

## 2017-08-06 NOTE — Discharge Instructions (Signed)
Supplemental Discharge Instructions for  Pacemaker/Defibrillator Patients  Activity No heavy lifting or vigorous activity with your left/right arm for 6 to 8 weeks.  Do not raise your left/right arm above your head for one week.  Gradually raise your affected arm as drawn below.              08/09/17                    08/10/17                    08/11/17                   08/12/17 __  NO DRIVING for  1 week   ; you may begin driving on   10/23/79  , you may return to work 08/09/17 as discussed with Dr. [name]  WOUND CARE - Keep the wound area clean and dry.  Do not get this area wet for one week. No showers for one week; you may shower on  08/12/17  . - The tape/steri-strips on your wound will fall off; do not pull them off.  No bandage is needed on the site.  DO  NOT apply any creams, oils, or ointments to the wound area. - If you notice any drainage or discharge from the wound, any swelling or bruising at the site, or you develop a fever > 101? F after you are discharged home, call the office at once.  Special Instructions - You are still able to use cellular telephones; use the ear opposite the side where you have your pacemaker/defibrillator.  Avoid carrying your cellular phone near your device. - When traveling through airports, show security personnel your identification card to avoid being screened in the metal detectors.  Ask the security personnel to use the hand wand. - Avoid arc welding equipment, MRI testing (magnetic resonance imaging), TENS units (transcutaneous nerve stimulators).  Call the office for questions about other devices. - Avoid electrical appliances that are in poor condition or are not properly grounded. - Microwave ovens are safe to be near or to operate.  Additional information for defibrillator patients should your device go off: - If your device goes off ONCE and you feel fine afterward, notify the device clinic nurses. - If your device goes off ONCE and you  do not feel well afterward, call 911. - If your device goes off TWICE, call 911. - If your device goes off THREE times in one day, call 911.  DO NOT DRIVE YOURSELF OR A FAMILY MEMBER WITH A DEFIBRILLATOR TO THE HOSPITAL--CALL 911.     Information on my medicine - ELIQUIS (apixaban)  Why was Eliquis prescribed for you? Eliquis was prescribed for you to reduce the risk of a blood clot forming that can cause a stroke if you have a medical condition called atrial fibrillation (a type of irregular heartbeat).  What do You need to know about Eliquis ? Take your Eliquis TWICE DAILY - one tablet in the morning and one tablet in the evening with or without food. If you have difficulty swallowing the tablet whole please discuss with your pharmacist how to take the medication safely.  Take Eliquis exactly as prescribed by your doctor and DO NOT stop taking Eliquis without talking to the doctor who prescribed the medication.  Stopping may increase your risk of developing a stroke.  Refill your prescription before you run out.  After discharge, you should  have regular check-up appointments with your healthcare provider that is prescribing your Eliquis.  In the future your dose may need to be changed if your kidney function or weight changes by a significant amount or as you get older.  What do you do if you miss a dose? If you miss a dose, take it as soon as you remember on the same day and resume taking twice daily.  Do not take more than one dose of ELIQUIS at the same time to make up a missed dose.  Important Safety Information A possible side effect of Eliquis is bleeding. You should call your healthcare provider right away if you experience any of the following: ? Bleeding from an injury or your nose that does not stop. ? Unusual colored urine (red or dark brown) or unusual colored stools (red or black). ? Unusual bruising for unknown reasons. ? A serious fall or if you hit your head  (even if there is no bleeding).  Some medicines may interact with Eliquis and might increase your risk of bleeding or clotting while on Eliquis. To help avoid this, consult your healthcare provider or pharmacist prior to using any new prescription or non-prescription medications, including herbals, vitamins, non-steroidal anti-inflammatory drugs (NSAIDs) and supplements.  This website has more information on Eliquis (apixaban): http://www.eliquis.com/eliquis/home

## 2017-08-06 NOTE — Plan of Care (Signed)
  Progressing Clinical Measurements: Ability to maintain clinical measurements within normal limits will improve 08/06/2017 0229 - Progressing by Horris Latino, RN Note VSS. Elimination: Will not experience complications related to urinary retention 08/06/2017 0229 - Progressing by Horris Latino, RN Note Voids without difficulty. Pain Managment: General experience of comfort will improve 08/06/2017 0229 - Progressing by Horris Latino, RN Note Pain r/t ICD insertion resolved with po meds.

## 2017-08-06 NOTE — Care Management Note (Signed)
Case Management Note  Patient Details  Name: Richard Cochran MRN: [id number] Date of Birth: 1986/03/14  Subjective/Objective: Pt presented for Acute on Chronic CHF- New Afib plan for start on Eliquis. Pt is without Insurance. CM did provide pt with 30 day free card and patient assistance application.                  Action/Plan: No further needs from CM at this time.   Expected Discharge Date:  08/06/17               Expected Discharge Plan:  Home/Self Care  In-House Referral:  NA  Discharge planning Services  CM Consult, Medication Assistance  Post Acute Care Choice:  NA Choice offered to:  NA  DME Arranged:  N/A DME Agency:  NA  HH Arranged:  NA HH Agency:  NA  Status of Service:  Completed, signed off  If discussed at Long Length of Stay Meetings, dates discussed:    Additional Comments:  Gala Lewandowsky, RN 08/06/2017, 10:37 AM

## 2017-08-09 MED FILL — Lidocaine HCl Local Inj 1%: INTRAMUSCULAR | Qty: 20 | Status: AC

## 2017-08-09 MED FILL — Lidocaine HCl Local Inj 1%: INTRAMUSCULAR | Qty: 20 | Status: CN

## 2017-08-12 NOTE — Care Management (Signed)
08-12-17 1345   Late Entry: CM received information to fax to Patient Assistance via Fax. CM did fax to [employer]. No further needs from CM at this time. Gala Lewandowsky, RN,BSN [phone]

## 2017-08-13 ENCOUNTER — Telehealth: Payer: Self-pay

## 2017-08-13 NOTE — Telephone Encounter (Signed)
**Note De-Identified Richard Cochran Obfuscation** The pt faxed his [employer] pt assistance application to [name] already signed by provider.  He included his 2017 tax return and not his 2018. He is aware that [employer] may request his 2018 proof of income.

## 2017-08-19 ENCOUNTER — Encounter (INDEPENDENT_AMBULATORY_CARE_PROVIDER_SITE_OTHER): Payer: Self-pay

## 2017-08-19 ENCOUNTER — Other Ambulatory Visit: Payer: Self-pay | Admitting: Cardiology

## 2017-08-19 ENCOUNTER — Ambulatory Visit (INDEPENDENT_AMBULATORY_CARE_PROVIDER_SITE_OTHER): Payer: Self-pay | Admitting: *Deleted

## 2017-08-19 ENCOUNTER — Encounter: Payer: Self-pay | Admitting: *Deleted

## 2017-08-19 DIAGNOSIS — I5022 Chronic systolic (congestive) heart failure: Secondary | ICD-10-CM

## 2017-08-19 DIAGNOSIS — I420 Dilated cardiomyopathy: Secondary | ICD-10-CM

## 2017-08-19 DIAGNOSIS — I428 Other cardiomyopathies: Secondary | ICD-10-CM

## 2017-08-19 LAB — CUP PACEART INCLINIC DEVICE CHECK
Brady Statistic RV Percent Paced: 1 % — CL
Date Time Interrogation Session: 20190204050000
HighPow Impedance: 61 Ohm
Implantable Lead Model: 293
Implantable Lead Serial Number: 435292
Lead Channel Pacing Threshold Amplitude: 0.9 V
Lead Channel Pacing Threshold Pulse Width: 0.4 ms
Lead Channel Setting Pacing Pulse Width: 0.4 ms
MDC IDC LEAD IMPLANT DT: 20190121
MDC IDC LEAD LOCATION: 753860
MDC IDC MSMT LEADCHNL RV IMPEDANCE VALUE: 518 Ohm
MDC IDC MSMT LEADCHNL RV SENSING INTR AMPL: 25 mV — AB
MDC IDC PG IMPLANT DT: 20190121
MDC IDC PG SERIAL: 241142
MDC IDC SET LEADCHNL RV PACING AMPLITUDE: 3.5 V
MDC IDC SET LEADCHNL RV SENSING SENSITIVITY: 0.5 mV

## 2017-08-19 NOTE — Progress Notes (Signed)
Wound check appointment. Steri-strips removed. Wound without redness or edema. Incision edges approximated, wound well healed. Normal device function. Threshold, sensing, and impedances consistent with implant measurements. Device programmed at 3.5V for extra safety margin until 3 month visit. Histogram distribution appropriate for patient and level of activity. 96 ventricular arrhythmias noted- available EGMs show AF RVR. Patient educated about wound care, arm mobility, lifting restrictions, shock plan. ROV with Dr. [name] 11/08/17.

## 2017-08-20 ENCOUNTER — Encounter (HOSPITAL_COMMUNITY): Payer: Self-pay | Admitting: Internal Medicine

## 2017-08-21 ENCOUNTER — Telehealth: Payer: Self-pay | Admitting: Cardiology

## 2017-08-21 NOTE — Telephone Encounter (Signed)
I have the patients paperwork. It has ben sent to our Copy service for them to follow up on.

## 2017-08-21 NOTE — Telephone Encounter (Signed)
Per [employer] short term disabl. Paper work was faxed today...the patient would like if faxed. Dr [name] to fill out please.

## 2017-08-23 NOTE — Telephone Encounter (Signed)
I will as time allows. GT

## 2017-09-20 ENCOUNTER — Telehealth: Payer: Self-pay

## 2017-09-20 NOTE — Telephone Encounter (Signed)
Completed and given to MR.

## 2017-10-11 ENCOUNTER — Ambulatory Visit: Payer: Self-pay | Admitting: Physician Assistant

## 2017-11-08 ENCOUNTER — Ambulatory Visit (INDEPENDENT_AMBULATORY_CARE_PROVIDER_SITE_OTHER): Payer: Self-pay | Admitting: Physician Assistant

## 2017-11-08 ENCOUNTER — Encounter: Payer: Self-pay | Admitting: Internal Medicine

## 2017-11-08 ENCOUNTER — Ambulatory Visit (INDEPENDENT_AMBULATORY_CARE_PROVIDER_SITE_OTHER): Payer: Self-pay | Admitting: Internal Medicine

## 2017-11-08 ENCOUNTER — Encounter: Payer: Self-pay | Admitting: Physician Assistant

## 2017-11-08 VITALS — BP 144/94 | HR 67 | Ht 69.0 in | Wt 359.0 lb

## 2017-11-08 VITALS — BP 150/116 | HR 72 | Ht 69.0 in | Wt 358.0 lb

## 2017-11-08 DIAGNOSIS — I428 Other cardiomyopathies: Secondary | ICD-10-CM

## 2017-11-08 DIAGNOSIS — I5022 Chronic systolic (congestive) heart failure: Secondary | ICD-10-CM

## 2017-11-08 DIAGNOSIS — Z950 Presence of cardiac pacemaker: Secondary | ICD-10-CM

## 2017-11-08 DIAGNOSIS — I480 Paroxysmal atrial fibrillation: Secondary | ICD-10-CM

## 2017-11-08 DIAGNOSIS — Z95810 Presence of automatic (implantable) cardiac defibrillator: Secondary | ICD-10-CM

## 2017-11-08 DIAGNOSIS — R0683 Snoring: Secondary | ICD-10-CM

## 2017-11-08 DIAGNOSIS — I420 Dilated cardiomyopathy: Secondary | ICD-10-CM

## 2017-11-08 DIAGNOSIS — I10 Essential (primary) hypertension: Secondary | ICD-10-CM

## 2017-11-08 MED ORDER — ISOSORBIDE MONONITRATE ER 30 MG PO TB24: 30.0000 mg | ORAL_TABLET | Freq: Every day | ORAL | 3 refills | Status: DC

## 2017-11-08 NOTE — Progress Notes (Signed)
Cardiology Office Note    Date:  11/08/2017   ID:  Richard Cochran, DOB [birth year], MRN [id number]  PCP:  System, Pcp Not In  Cardiologist:  Dr. [name] Electrophysiologist: Dr. [name]  Chief Complaint  Patient presents with  . Follow-up    medication makes him sleepy causes him to be unable to work causes him to go to disability, pt denies chest pains, SOB, swelling in hands/feet    History of Present Illness:  Richard Cochran is a 32 y.o. male with PMH of HTN, CKD stage II, super morbid obesity and recently diagnosed NICM. He was transferred from [facility] to [facility] on 03/13/2017 with NSTEMI. There was no prior history of CAD. No known history of hyperlipidemia or diabetes. Initial troponin was 2.85. Repeat troponin went up to 9.02, and eventually peaked at 14.77. He eventually underwent cardiac cath on 03/14/2017 with EF 15-20%, normal coronaries, moderately elevated LVEDP. It was recommended to place patient on aggressive medical therapy for NICM. Echocardiogram obtained on the same day showed EF 10-15%, grade 1 DD, severe LVH. He also had elevated d-dimer at OSH, V/Q scan was obtained on 03/15/2017 that showed normal perfusion. He was placed on carvedilol, lisinopril, spironolactone as well. He was eventually discharged on 03/17/2017 with dry weight of 341 pounds.  Patient was eventually discharged on LifeVest this was placed on the patient prior to discharge.  Lisinopril was later switched to [facility] as outpatient.  There was some discussion of having cardiac MRI in his case, however he was only able to be fitted in a cardiac MRI machine.  His heart failure medications were maximized, repeat echocardiogram on 07/15/2017 continue to show EF 10 to 15%, grade 2 DD.  He was seen by Dr. [name] and eventually underwent [facility] Scientific ICD implantation on 08/05/2017.  Device was interrogated on 08/19/2017, interrogation report mentioned A. fib with RVR.  He was placed on Eliquis 5 mg  twice daily.  Patient presents today for cardiology office visit.  He denies any chest pain or significant shortness of breath.  He has no orthopnea or PND.  EKG showed normal sinus rhythm with T wave inversion inferior leads.  His blood pressure remain elevated in the 140s.  I want to add Imdur 30 mg daily.  Hopefully in the future may also consider addition of hydralazine as well for his LV dysfunction.  Otherwise he is tolerating spironolactone, Entresto and carvedilol.  It is unclear to me how long he was in atrial fibrillation and whether this previously undiagnosed arrhythmia was contributing to his cardiomyopathy.  If he had a prolonged episode of atrial fibrillation with RVR, we may have to switch to carvedilol to Toprol-XL for better rate control and suppression of atrial fibrillation.  He has not been able to obtain Medicaid to reschedule his sleep study.  He also was to apply for permanent disability.  He used to work as a Administrator on a golf course, he has not been able to do that since.  I will defer the application for permanent disability to his primary cardiologist [name]. [name].  He is still on aspirin since time, given the need for Eliquis and lack of any coronary artery disease on previous cath, I will discontinue aspirin.   Past Medical History:  Diagnosis Date  . Childhood asthma   . CKD (chronic kidney disease)    "left kidney weaker than right" (03/13/2017)  . Hypertension   . NSTEMI (non-ST elevated myocardial infarction) (HCC) 03/13/2017   /  notes 03/13/2017  . Renal insufficiency     Past Surgical History:  Procedure Laterality Date  . ICD IMPLANT N/A 08/05/2017   Procedure: ICD IMPLANT;  Surgeon: Marinus Maw, MD;  Location: [facility] INVASIVE CV LAB;  Service: Cardiovascular;  Laterality: N/A;  . LEFT HEART CATH AND CORONARY ANGIOGRAPHY N/A 03/14/2017   Procedure: LEFT HEART CATH AND CORONARY ANGIOGRAPHY;  Surgeon: Swaziland, Peter M, MD;  Location: [facility] INVASIVE CV LAB;   Service: Cardiovascular;  Laterality: N/A;  . NO PAST SURGERIES      Current Medications: Outpatient Medications Prior to Visit  Medication Sig Dispense Refill  . acetaminophen (TYLENOL) 325 MG tablet Take 2 tablets (650 mg total) by mouth every 4 (four) hours as needed for headache or mild pain.    Marland Kitchen apixaban (ELIQUIS) 5 MG TABS tablet Take 1 tablet (5 mg total) by mouth 2 (two) times daily. 60 tablet 6  . atorvastatin (LIPITOR) 20 MG tablet Take 1 tablet (20 mg total) by mouth daily at 6 PM. 90 tablet 3  . carvedilol (COREG) 25 MG tablet TAKE 1 & 1/2 (ONE & ONE-HALF) TABLETS (37.5 MG TOTAL) BY MOUTH TWICE DAILY WITH MEALS 90 tablet 3  . sacubitril-valsartan (ENTRESTO) 97-103 MG Take 1 tablet by mouth 2 (two) times daily. 60 tablet 11  . spironolactone (ALDACTONE) 25 MG tablet Take 0.5 tablets (12.5 mg total) by mouth daily. 90 tablet 3  . aspirin EC 81 MG tablet Take 81 mg by mouth daily.     No facility-administered medications prior to visit.      Allergies:   Patient has no known allergies.   Social History   Socioeconomic History  . Marital status: Single    Spouse name: Not on file  . Number of children: Not on file  . Years of education: Not on file  . Highest education level: Not on file  Occupational History  . Not on file  Social Needs  . Financial resource strain: Not on file  . Food insecurity:    Worry: Not on file    Inability: Not on file  . Transportation needs:    Medical: Not on file    Non-medical: Not on file  Tobacco Use  . Smoking status: Former Smoker    Years: 2.00    Types: Cigarettes  . Smokeless tobacco: Never Used  . Tobacco comment: 03/13/2017 "quit when I was 11 or 12"  Substance and Sexual Activity  . Alcohol use: Yes    Comment: 03/13/2017 "I drink on my birthday; nothing else"  . Drug use: No  . Sexual activity: Not on file  Lifestyle  . Physical activity:    Days per week: Not on file    Minutes per session: Not on file  . Stress:  Not on file  Relationships  . Social connections:    Talks on phone: Not on file    Gets together: Not on file    Attends religious service: Not on file    Active member of club or organization: Not on file    Attends meetings of clubs or organizations: Not on file    Relationship status: Not on file  Other Topics Concern  . Not on file  Social History Narrative  . Not on file     Family History:  The patient's family history includes Hypertension in his father and mother.   ROS:   Please see the history of present illness.    ROS All other systems  reviewed and are negative.   PHYSICAL EXAM:   VS:  BP (!) 144/94 (BP Location: Left Arm)   Pulse 67   Ht 5\' 9"  (1.753 m)   Wt (!) 359 lb (162.8 kg)   BMI 53.02 kg/m    GEN: Well nourished, well developed, in no acute distress  HEENT: normal  Neck: no JVD, carotid bruits, or masses Cardiac: RRR; no murmurs, rubs, or gallops,no edema  Respiratory:  clear to auscultation bilaterally, normal work of breathing GI: soft, nontender, nondistended, + BS MS: no deformity or atrophy  Skin: warm and dry, no rash Neuro:  Alert and Oriented x 3, Strength and sensation are intact Psych: euthymic mood, full affect  Wt Readings from Last 3 Encounters:  11/08/17 (!) 359 lb (162.8 kg)  08/06/17 (!) 349 lb (158.3 kg)  07/30/17 (!) 356 lb 9.6 oz (161.8 kg)      Studies/Labs Reviewed:   EKG:  EKG is ordered today.  The ekg ordered today demonstrates normal sinus rhythm with T wave inversions in the inferior leads.  Recent Labs: 03/15/2017: TSH 2.034 03/17/2017: B Natriuretic Peptide 30.0 07/30/2017: BUN 17; Creatinine, Ser 1.29; Hemoglobin 14.1; Platelets 249; Potassium 4.6; Sodium 141   Lipid Panel    Component Value Date/Time   CHOL 175 03/14/2017 0214   TRIG 88 03/14/2017 0214   HDL 53 03/14/2017 0214   CHOLHDL 3.3 03/14/2017 0214   VLDL 18 03/14/2017 0214   LDLCALC 104 (H) 03/14/2017 0214    Additional studies/ records that  were reviewed today include:   Cath 03/14/2017 Conclusion     There is severe left ventricular systolic dysfunction.  LV end diastolic pressure is moderately elevated.  The left ventricular ejection fraction is less than 25% by visual estimate.   1. Normal coronary anatomy 2. Marked LV enlargement with severe global hypokinesis and overall EF 15-20%. 3. Moderately elevated LVEDP  Plan: aggressive therapy for CHF.      Echo 07/15/2017 LV EF: 10% -   15% Study Conclusions  - Left ventricle: The cavity size was severely dilated. There was   moderate concentric hypertrophy. Systolic function was severely   reduced. The estimated ejection fraction was in the range of 10%   to 15%. Diffuse hypokinesis. Features are consistent with a   pseudonormal left ventricular filling pattern, with concomitant   abnormal relaxation and increased filling pressure (grade 2   diastolic dysfunction). Doppler parameters are consistent with   high ventricular filling pressure. - Aortic valve: Transvalvular velocity was within the normal range.   There was no stenosis. There was no regurgitation. - Mitral valve: Transvalvular velocity was within the normal range.   There was no evidence for stenosis. There was no regurgitation. - Right ventricle: The cavity size was normal. Wall thickness was   normal. Systolic function was normal. - Tricuspid valve: There was no regurgitation.   ICD implantation 08/05/2017 Conclusion   CONCLUSIONS:   1. Non-ischemic cardiomyopathy with chronic New York Heart Association class III heart failure.   2. Successful ICD implantation.   3.  No early apparent complications.  [employer] (serial number [id number]) right ventricular defibrillator lead  [employer] (serial Number [phone]) ICD  ASSESSMENT:    1. NICM (nonischemic cardiomyopathy) (HCC)   2. Chronic systolic (congestive) heart failure (HCC)   3. Essential hypertension   4. Snoring   5. Morbid obesity  (HCC)   6. PAF (paroxysmal atrial fibrillation) (HCC)   7. Pacemaker  PLAN:  In order of problems listed above:  1. Nonischemic cardiomyopathy s/p [employer] ICD: Baseline EF 10 to 15%, recently underwent ICD implantation for primary prevention.  The cause of nonischemic cardiomyopathy is still in question, potential possibilities include hypertension induced cardiomyopathy, tachycardia mediated cardiomyopathy, and potentially infiltrative disease.  He was unable to undergo cardiac MRI due to his body size.  He was recently diagnosed with paroxysmal atrial fibrillation, however the duration of atrial fibrillation is unknown to me based on interrogation report.  2. PAF: On Eliquis, will discontinue aspirin given lack of coronary artery disease. CHA2DS2-Vasc score 2 (HTN, CHF).   3. Chronic systolic heart failure: Euvolemic on physical exam  4. Hypertension: Blood pressure remain elevated maximum dose of carvedilol and Entresto, he is also on low-dose Aldactone.  We will add Imdur 30 mg daily to his medical regimen, potentially also add hydralazine later for LV dysfunction.  5. Super morbid obesity: This is a primary issue that is contributing to his cardiomyopathy and blood pressure, aggressive weight loss is recommended  6. Snoring: Previously scheduled to undergo sleep study, however this was rescheduled to later because he has not been approved for Medicaid and the cost of sleep study is too much for him.    Medication Adjustments/Labs and Tests Ordered: Current medicines are reviewed at length with the patient today.  Concerns regarding medicines are outlined above.  Medication changes, Labs and Tests ordered today are listed in the Patient Instructions below. Patient Instructions  Azalee Course, PA-C has recommended making the following medication changes: 1. STOP Aspirin 2. START Isosorbide (Imdur) 30 mg - take 1 tablet daily  Please keep your upcoming appointments with Dr  [name] and Dr [name].    Ramond Dial, [place]  11/08/2017 9:24 AM    [facility] [address] [place], [place], [place]  [address] Phone: [phone]; Fax: [phone]

## 2017-11-08 NOTE — Progress Notes (Addendum)
HPI Mr. Cerda returns today for ongoing evaluation of his ICD. He is a pleasant morbidly obese 32 yo man with chronic systolic heart failure, HTN, s/p ICD insertion. He has been stable in the interim but has not gone back to work. He states that his meds make him sleepy. He has not had an ICD shock and he denies ETOH abuse or medical non-compliance. He has asked about disability. He denies any anginal symptoms. No Known Allergies   Current Outpatient Medications  Medication Sig Dispense Refill  . acetaminophen (TYLENOL) 325 MG tablet Take 2 tablets (650 mg total) by mouth every 4 (four) hours as needed for headache or mild pain.    Marland Kitchen apixaban (ELIQUIS) 5 MG TABS tablet Take 1 tablet (5 mg total) by mouth 2 (two) times daily. 60 tablet 6  . atorvastatin (LIPITOR) 20 MG tablet Take 1 tablet (20 mg total) by mouth daily at 6 PM. 90 tablet 3  . carvedilol (COREG) 25 MG tablet TAKE 1 & 1/2 (ONE & ONE-HALF) TABLETS (37.5 MG TOTAL) BY MOUTH TWICE DAILY WITH MEALS 90 tablet 3  . isosorbide mononitrate (IMDUR) 30 MG 24 hr tablet Take 1 tablet (30 mg total) by mouth daily. 90 tablet 3  . sacubitril-valsartan (ENTRESTO) 97-103 MG Take 1 tablet by mouth 2 (two) times daily. 60 tablet 11  . spironolactone (ALDACTONE) 25 MG tablet Take 0.5 tablets (12.5 mg total) by mouth daily. 90 tablet 3   No current facility-administered medications for this visit.      Past Medical History:  Diagnosis Date  . [employer] ICD (implantable cardioverter-defibrillator) in place   . Childhood asthma   . CKD (chronic kidney disease)    "left kidney weaker than right" (03/13/2017)  . Hypertension   . NSTEMI (non-ST elevated myocardial infarction) (HCC) 03/13/2017   [name] 03/13/2017  . PAF (paroxysmal atrial fibrillation) (HCC)   . Renal insufficiency     ROS:   All systems reviewed and negative except as noted in the HPI.   Past Surgical History:  Procedure Laterality Date  . ICD IMPLANT N/A  08/05/2017   Procedure: ICD IMPLANT;  Surgeon: Marinus Maw, MD;  Location: [facility] INVASIVE CV LAB;  Service: Cardiovascular;  Laterality: N/A;  . LEFT HEART CATH AND CORONARY ANGIOGRAPHY N/A 03/14/2017   Procedure: LEFT HEART CATH AND CORONARY ANGIOGRAPHY;  Surgeon: Swaziland, Peter M, MD;  Location: [facility] INVASIVE CV LAB;  Service: Cardiovascular;  Laterality: N/A;  . NO PAST SURGERIES       Family History  Problem Relation Age of Onset  . Hypertension Mother   . Hypertension Father      Social History   Socioeconomic History  . Marital status: Single    Spouse name: Not on file  . Number of children: Not on file  . Years of education: Not on file  . Highest education level: Not on file  Occupational History  . Not on file  Social Needs  . Financial resource strain: Not on file  . Food insecurity:    Worry: Not on file    Inability: Not on file  . Transportation needs:    Medical: Not on file    Non-medical: Not on file  Tobacco Use  . Smoking status: Former Smoker    Years: 2.00    Types: Cigarettes  . Smokeless tobacco: Never Used  . Tobacco comment: 03/13/2017 "quit when I was 11 or 12"  Substance and Sexual Activity  .  Alcohol use: Yes    Comment: 03/13/2017 "I drink on my birthday; nothing else"  . Drug use: No  . Sexual activity: Not on file  Lifestyle  . Physical activity:    Days per week: Not on file    Minutes per session: Not on file  . Stress: Not on file  Relationships  . Social connections:    Talks on phone: Not on file    Gets together: Not on file    Attends religious service: Not on file    Active member of club or organization: Not on file    Attends meetings of clubs or organizations: Not on file    Relationship status: Not on file  . Intimate partner violence:    Fear of current or ex partner: Not on file    Emotionally abused: Not on file    Physically abused: Not on file    Forced sexual activity: Not on file  Other Topics Concern  . Not on  file  Social History Narrative  . Not on file     BP (!) 150/116   Pulse 72   Ht 5\' 9"  (1.753 m)   Wt (!) 358 lb (162.4 kg)   SpO2 99%   BMI 52.87 kg/m   Physical Exam:  Well appearing 32 yo man, obese NAD HEENT: Unremarkable Neck:  Unable to assess JVD, no thyromegally Lymphatics:  No adenopathy Back:  No CVA tenderness Lungs:  Clear with no wheezes HEART:  Regular rate rhythm, no murmurs, no rubs, no clicks Abd:  soft, positive bowel sounds, no organomegally, no rebound, no guarding Ext:  2 plus pulses, no edema, no cyanosis, no clubbing Skin:  No rashes no nodules Neuro:  CN II through XII intact, motor grossly intact  EKG - NSR with IVCD  DEVICE  Normal device function.  See PaceArt for details.   Assess/Plan: 1. Chronic systolic heart failure - his symptoms are class 2. He will continue his current meds. 2. Obesity - this is a big problem. I have encouraged the patient to lose weight. 3. ICD - his [place] [employer] ICD is working normally. He has an estimated 15 years of battery longevity.   Leonia Reeves.D.

## 2017-11-08 NOTE — Patient Instructions (Addendum)
Azalee Course, PA-C has recommended making the following medication changes: 1. STOP Aspirin 2. START Isosorbide (Imdur) 30 mg - take 1 tablet daily  Please keep your upcoming appointments with Dr [name] and Dr [name].

## 2017-11-08 NOTE — Patient Instructions (Addendum)
Medication Instructions:  Your physician recommends that you continue on your current medications as directed. Please refer to the Current Medication list given to you today.  Labwork: None ordered.  Testing/Procedures: None ordered.  Follow-Up: Your physician wants you to follow-up in: 9 months with Dr. [name].   You will receive a reminder letter in the mail two months in advance. If you don't receive a letter, please call our office to schedule the follow-up appointment.  Remote monitoring is used to monitor your ICD from home. This monitoring reduces the number of office visits required to check your device to one time per year. It allows [name] to keep an eye on the functioning of your device to ensure it is working properly. You are scheduled for a device check from home on 11/08/2017. You may send your transmission at any time that day. If you have a wireless device, the transmission will be sent automatically. After your physician reviews your transmission, you will receive a postcard with your next transmission date.  Any Other Special Instructions Will Be Listed Below (If Applicable).  If you need a refill on your cardiac medications before your next appointment, please call your pharmacy.

## 2017-11-11 ENCOUNTER — Telehealth: Payer: Self-pay | Admitting: Physician Assistant

## 2017-11-11 NOTE — Telephone Encounter (Signed)
Muscle pain is a possible (but rare) adverse effect of Imdur. Imdur started less than 1 weeks ago, will recommend to continue for few more days and contact clinic if symptoms not improved/resolved.   APP/MD will need to change therapy if/as needed.

## 2017-11-11 NOTE — Telephone Encounter (Signed)
Patient stated that he recently started Imdur 30 mg on Saturday. Starting Sunday he started having leg weakness and a heavy feeling. He stated that it is only when he stands up. He is wondering if it could be from the Imdur. Message has been routed to pharmd.

## 2017-11-11 NOTE — Telephone Encounter (Signed)
New message      Pt c/o medication issue:  1. Name of Medication: isosorbide mononitrate (IMDUR) 30 MG 24 hr tablet  2. How are you currently taking this medication (dosage and times per day)? As written  3. Are you having a reaction (difficulty breathing--STAT)? Headache, leg discomfort  4. What is your medication issue? Patient calling to report leg discomfort when standing. Patient thinks this is due to isosorbide mononitrate (IMDUR) 30 MG 24 hr tablet.

## 2017-11-12 NOTE — Telephone Encounter (Signed)
Patient has been made aware of the recommendation and will call back if the symptoms do not subside.

## 2017-11-18 ENCOUNTER — Other Ambulatory Visit: Payer: Self-pay | Admitting: Internal Medicine

## 2017-11-18 ENCOUNTER — Ambulatory Visit (INDEPENDENT_AMBULATORY_CARE_PROVIDER_SITE_OTHER): Payer: Self-pay | Admitting: *Deleted

## 2017-11-18 DIAGNOSIS — I428 Other cardiomyopathies: Secondary | ICD-10-CM

## 2017-11-18 NOTE — Progress Notes (Signed)
Remote ICD transmission.   

## 2017-11-20 ENCOUNTER — Encounter: Payer: Self-pay | Admitting: Cardiology

## 2017-12-02 LAB — CUP PACEART REMOTE DEVICE CHECK
Date Time Interrogation Session: 20190520150214
MDC IDC LEAD IMPLANT DT: 20190121
MDC IDC LEAD LOCATION: 753860
MDC IDC LEAD SERIAL: 435292
MDC IDC PG IMPLANT DT: 20190121
Pulse Gen Serial Number: 241142

## 2017-12-12 ENCOUNTER — Telehealth: Payer: Self-pay | Admitting: Cardiovascular Disease

## 2017-12-12 NOTE — Telephone Encounter (Signed)
New message    Pt is calling asking for a call back. He said he needs to speak to pharmacist about his entresto. Please call.

## 2017-12-12 NOTE — Telephone Encounter (Signed)
Will mail patient assistance paperwork . Patient will complete and bring to f/u appointment with DR [name] on 01/10/18.

## 2017-12-12 NOTE — Telephone Encounter (Signed)
Patient needs renewal of patient assistance program for [facility].

## 2017-12-23 ENCOUNTER — Other Ambulatory Visit: Payer: Self-pay | Admitting: Cardiology

## 2017-12-23 ENCOUNTER — Telehealth: Payer: Self-pay | Admitting: Cardiology

## 2017-12-24 ENCOUNTER — Telehealth: Payer: Self-pay | Admitting: Nurse Practitioner

## 2017-12-24 ENCOUNTER — Telehealth: Payer: Self-pay | Admitting: Cardiology

## 2017-12-24 NOTE — Telephone Encounter (Signed)
New Message   Pt calling to see if we received a fax from his physician regarding tooth extraction. Please call

## 2017-12-24 NOTE — Telephone Encounter (Signed)
Patient has no medical history of congenital heart disease, cardiac valve replacements or infective endocarditis.  He will not need antibiotic prophylaxis for dental procedures.    We will need more information about dental procedures before determining if Eliquis needs to be held.  We do not hold anticoagulation for routine cleanings, fillings or crowns.  Nor do we hold for single extractions.  Anything beyond this will need to contact us with specific procedure information.

## 2017-12-24 NOTE — Telephone Encounter (Signed)
   [facility] Pre-operative Risk Assessment    Request for surgical clearance:  1. What type of surgery is being performed? Medical Clearance Request for Dental Procedure   2. When is this surgery scheduled? TBD   3. What type of clearance is required (medical clearance vs. Pharmacy clearance to hold med vs. Both)? Both  4. Are there any medications that need to be held prior to surgery and how long? History of Pacemaker and on Eliquis   Are prophylactic antibiotics needed and need ok to proceed with dental treatment (no specific treatment listed)  5. Practice name and name of physician performing surgery? [facility]   6. What is your office phone number [phone]    7.   What is your office fax number [phone]  8.   Anesthesia type (None, local, MAC, general) ? Local anesthesia with 2% Lidocaine, 1:100,000 epinephrine. For some surgical procedures the epinephrine concentrate may be increased to 1:50,000 for homeostasis   Richard Cochran 12/24/2017, 3:32 PM  _________________________________________________________________   (provider comments below)

## 2017-12-24 NOTE — Telephone Encounter (Signed)
error 

## 2017-12-24 NOTE — Telephone Encounter (Signed)
Pt aware we have nothing on file.  He reports he will re-fax it.  Reviewed fax # with him for [phone].

## 2017-12-24 NOTE — Telephone Encounter (Signed)
Tylenol - pt is aware

## 2017-12-24 NOTE — Telephone Encounter (Signed)
New Message   Pt wants to know what he can take for pain until his clearance is approved because his mouth is hurting really bad. Please call

## 2017-12-25 ENCOUNTER — Telehealth: Payer: Self-pay | Admitting: Cardiology

## 2017-12-25 NOTE — Telephone Encounter (Signed)
Faxing pharmacy recommendations to requesting provider, at the fax # listed below.

## 2017-12-25 NOTE — Telephone Encounter (Signed)
New Message   Richard Cochran is calling because he spoke with his dentist and they said they faxed over 5 more forms to be signed and he wants to know if we have received them. Please call

## 2017-12-25 NOTE — Telephone Encounter (Signed)
Spoke with patient who reports he spoke with the dentist and has an appt at 9 am.

## 2018-01-10 ENCOUNTER — Ambulatory Visit: Payer: Self-pay | Admitting: Cardiology

## 2018-02-13 NOTE — Progress Notes (Signed)
Cardiology Office Note:    Date:  02/14/2018   ID:  Richard Cochran, DOB 14-Feb-1986, MRN [id number]  PCP:  System, Pcp Not In  Cardiologist:  Richard Schultz, MD    Referring MD: No ref. provider found     History of Present Illness:    Richard Cochran is a 32 y.o. 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 ECHO 03/14/17 and 07/15/17 showing EF once again of 10-15%. Likely HTN cardiomyopathy.  No shock from LifeVest.  No CAD on cardiac catheterization  Blood pressure much better controlled, 107/85 at home.   Sleep study on hold because of no insurance.   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.  02/14/2018- no orthopnea no PND.  He does have apnea-like symptoms at night his significant other states.  Cannot afford sleep study because he does not have insurance.  It would cost him $2000 out of pocket.  Overall no chest pain.  He has excellent battery life on his ICD.  Dr. [name] is monitoring.  He had atrial fibrillation in the hospital setting.  He is on Eliquis.  No bleeding.  Past Medical History:  Diagnosis Date  . [employer] ICD (implantable cardioverter-defibrillator) in place   . Childhood asthma   . CKD (chronic kidney disease)    "left kidney weaker than right" (03/13/2017)  . Hypertension   . NSTEMI (non-ST elevated myocardial infarction) (HCC) 03/13/2017   Richard Cochran 03/13/2017  . PAF (paroxysmal atrial fibrillation) (HCC)   . Renal insufficiency     Past Surgical History:  Procedure Laterality Date  . ICD IMPLANT N/A 08/05/2017   Procedure: ICD IMPLANT;  Surgeon: Marinus Maw, MD;  Location: [facility] INVASIVE CV LAB;  Service: Cardiovascular;  Laterality: N/A;  . LEFT HEART CATH AND CORONARY ANGIOGRAPHY N/A 03/14/2017   Procedure: LEFT HEART CATH AND CORONARY ANGIOGRAPHY;  Surgeon: Swaziland, Peter M, MD;  Location: [facility] INVASIVE CV LAB;  Service: Cardiovascular;  Laterality: N/A;  . NO PAST SURGERIES      Current  Medications: Current Meds  Medication Sig  . acetaminophen (TYLENOL) 325 MG tablet Take 2 tablets (650 mg total) by mouth every 4 (four) hours as needed for headache or mild pain.  Marland Kitchen apixaban (ELIQUIS) 5 MG TABS tablet Take 1 tablet (5 mg total) by mouth 2 (two) times daily.  Marland Kitchen atorvastatin (LIPITOR) 20 MG tablet Take 1 tablet (20 mg total) by mouth daily at 6 PM.  . carvedilol (COREG) 25 MG tablet TAKE 1 & 1/2 (ONE & ONE-HALF) TABLETS (37.5 MG TOTAL) BY MOUTH TWICE DAILY WITH MEALS  . isosorbide mononitrate (IMDUR) 30 MG 24 hr tablet Take 1 tablet (30 mg total) by mouth daily.  . sacubitril-valsartan (ENTRESTO) 97-103 MG Take 1 tablet by mouth 2 (two) times daily.  Marland Kitchen spironolactone (ALDACTONE) 25 MG tablet Take 0.5 tablets (12.5 mg total) by mouth daily.     Allergies:   Patient has no known allergies.   Social History   Socioeconomic History  . Marital status: Single    Spouse name: Not on file  . Number of children: Not on file  . Years of education: Not on file  . Highest education level: Not on file  Occupational History  . Not on file  Social Needs  . Financial resource strain: Not on file  . Food insecurity:    Worry: Not on file    Inability: Not on file  . Transportation needs:  Medical: Not on file    Non-medical: Not on file  Tobacco Use  . Smoking status: Former Smoker    Years: 2.00    Types: Cigarettes  . Smokeless tobacco: Never Used  . Tobacco comment: 03/13/2017 "quit when I was 11 or 12"  Substance and Sexual Activity  . Alcohol use: Yes    Comment: 03/13/2017 "I drink on my birthday; nothing else"  . Drug use: No  . Sexual activity: Not on file  Lifestyle  . Physical activity:    Days per week: Not on file    Minutes per session: Not on file  . Stress: Not on file  Relationships  . Social connections:    Talks on phone: Not on file    Gets together: Not on file    Attends religious service: Not on file    Active member of club or organization:  Not on file    Attends meetings of clubs or organizations: Not on file    Relationship status: Not on file  Other Topics Concern  . Not on file  Social History Narrative  . Not on file     Family History: The patient's family history includes Hypertension in his father and mother. ROS:   Please see the history of present illness.   All other review of systems negative  EKGs/Labs/Other Studies Reviewed:    The following studies were reviewed today: Echocardiograms, cardiac catheterization, lab work, EKG  EKG: Normal sinus rhythm, LVH, no interventricular conduction   Recent Labs: 03/15/2017: TSH 2.034 03/17/2017: B Natriuretic Peptide 30.0 07/30/2017: BUN 17; Creatinine, Ser 1.29; Hemoglobin 14.1; Platelets 249; Potassium 4.6; Sodium 141  Recent Lipid Panel    Component Value Date/Time   CHOL 175 03/14/2017 0214   TRIG 88 03/14/2017 0214   HDL 53 03/14/2017 0214   CHOLHDL 3.3 03/14/2017 0214   VLDL 18 03/14/2017 0214   LDLCALC 104 (H) 03/14/2017 0214    Physical Exam:    VS:  BP (!) 132/98   Pulse 71   Ht 5\' 9"  (1.753 m)   Wt (!) 363 lb 12.8 oz (165 kg)   SpO2 96%   BMI 53.72 kg/m     Wt Readings from Last 3 Encounters:  02/14/18 (!) 363 lb 12.8 oz (165 kg)  11/08/17 (!) 358 lb (162.4 kg)  11/08/17 (!) 359 lb (162.8 kg)     GEN: Well nourished, well developed, in no acute distress, obese  HEENT: normal  Neck: no JVD, carotid bruits, or masses Cardiac: RRR; no murmurs, rubs, or gallops,no edema  Respiratory:  clear to auscultation bilaterally, normal work of breathing GI: soft, nontender, nondistended, + BS MS: no deformity or atrophy  Skin: warm and dry, no rash Neuro:  Alert and Oriented x 3, Strength and sensation are intact Psych: euthymic mood, full affect   ASSESSMENT:    1. Essential hypertension   2. PAF (paroxysmal atrial fibrillation) (HCC)   3. Medication management    PLAN:    In order of problems listed above:  Dilated  cardiomyopathy/nonischemic cardiomyopathy -Likely hypertensive in origin given his many years of elevated blood pressure.  Despite medications over the last few months and normal blood pressure control, his ejection fraction remains low at 10-15%.  We will refer him to electrophysiology for defibrillator.  Narrow complex QRS.  Currently NYHA class II -Continue medications as above.  Appreciate pharmacy assistance with titration of medications.  Excellent.   Paroxysmal atrial fibrillation - In the hospital was  noted to have atrial fibrillation generally in the 80s in the 90s.  Eliquis.  Encouraged sleep study however he cannot afford it without medical insurance.  Elevate pillows.  Morbid obesity -Continue to encourage weight loss, no significant changes.   Medication Adjustments/Labs and Tests Ordered: Current medicines are reviewed at length with the patient today.  Concerns regarding medicines are outlined above.  Orders Placed This Encounter  Procedures  . Basic metabolic panel  . CBC   No orders of the defined types were placed in this encounter.   Signed, Richard Schultz, MD  02/14/2018 11:48 AM    [facility]

## 2018-02-14 ENCOUNTER — Ambulatory Visit (INDEPENDENT_AMBULATORY_CARE_PROVIDER_SITE_OTHER): Payer: Self-pay | Admitting: Cardiology

## 2018-02-14 ENCOUNTER — Encounter: Payer: Self-pay | Admitting: Cardiology

## 2018-02-14 VITALS — BP 132/98 | HR 71 | Ht 69.0 in | Wt 363.8 lb

## 2018-02-14 DIAGNOSIS — I428 Other cardiomyopathies: Secondary | ICD-10-CM

## 2018-02-14 DIAGNOSIS — I10 Essential (primary) hypertension: Secondary | ICD-10-CM

## 2018-02-14 DIAGNOSIS — Z79899 Other long term (current) drug therapy: Secondary | ICD-10-CM

## 2018-02-14 DIAGNOSIS — I480 Paroxysmal atrial fibrillation: Secondary | ICD-10-CM

## 2018-02-14 NOTE — Patient Instructions (Signed)
Medication Instructions:  The current medical regimen is effective;  continue present plan and medications.  Labwork: Please have blood work today (CBC, BMP)  Follow-Up: Follow up in 6 months with Norma Fredrickson, NP.  You will receive a letter in the mail 2 months before you are due.  Please call us when you receive this letter to schedule your follow up appointment.  Follow up in 1 year with Dr. [name].  You will receive a letter in the mail 2 months before you are due.  Please call us when you receive this letter to schedule your follow up appointment.  If you need a refill on your cardiac medications before your next appointment, please call your pharmacy.  Thank you for choosing [facility]!!

## 2018-02-15 LAB — BASIC METABOLIC PANEL
BUN/Creatinine Ratio: 13 (ref 9–20)
BUN: 16 mg/dL (ref 6–20)
CALCIUM: 9.7 mg/dL (ref 8.7–10.2)
CHLORIDE: 102 mmol/L (ref 96–106)
CO2: 24 mmol/L (ref 20–29)
Creatinine, Ser: 1.27 mg/dL (ref 0.76–1.27)
GFR calc Af Amer: 86 mL/min/{1.73_m2} (ref >59)
GFR calc non Af Amer: 74 mL/min/{1.73_m2} (ref >59)
GLUCOSE: 92 mg/dL (ref 65–99)
Potassium: 4.7 mmol/L (ref 3.5–5.2)
SODIUM: 141 mmol/L (ref 134–144)

## 2018-02-15 LAB — CBC
HEMOGLOBIN: 13.1 g/dL (ref 13.0–17.7)
Hematocrit: 41.1 % (ref 37.5–51.0)
MCH: 26.8 pg (ref 26.6–33.0)
MCHC: 31.9 g/dL (ref 31.5–35.7)
MCV: 84 fL (ref 79–97)
PLATELETS: 227 10*3/uL (ref 150–450)
RBC: 4.88 x10E6/uL (ref 4.14–5.80)
RDW: 14.2 % (ref 12.3–15.4)
WBC: 9.3 10*3/uL (ref 3.4–10.8)

## 2018-02-17 ENCOUNTER — Telehealth: Payer: Self-pay | Admitting: Cardiology

## 2018-02-17 ENCOUNTER — Ambulatory Visit (INDEPENDENT_AMBULATORY_CARE_PROVIDER_SITE_OTHER): Payer: Self-pay | Admitting: *Deleted

## 2018-02-17 ENCOUNTER — Telehealth: Payer: Self-pay

## 2018-02-17 DIAGNOSIS — I428 Other cardiomyopathies: Secondary | ICD-10-CM

## 2018-02-17 NOTE — Telephone Encounter (Signed)
New problem   Pt stated he is returning a call to nurse concerning lab results. Please call pt.

## 2018-02-17 NOTE — Telephone Encounter (Signed)
Pt aware of lab results. No additional questions.  

## 2018-02-17 NOTE — Telephone Encounter (Signed)
The pt returned his [employer] Pt Asst application for Entresto to the office. I have completed the provider part of the application and placed it in Dr [name] mail bin awaiting his signature.

## 2018-02-17 NOTE — Progress Notes (Signed)
Remote ICD transmission.   

## 2018-02-18 NOTE — Telephone Encounter (Signed)
Dr [name] has signed the pts [employer] Pt Asst application and I have faxed all (no proof of income provided from the pt) to [employer].

## 2018-02-23 ENCOUNTER — Telehealth: Payer: Self-pay | Admitting: Physician Assistant

## 2018-02-23 NOTE — Telephone Encounter (Signed)
The patient called the answering service after-hours today. Having URI sx with PND/stuffy nose, cough, wants to know what he can take OTC. No acute cardiac sx. Discussed avoidance of decongestants like pseudoephedrine/phenylephine, may take antihistamine such as Claritin and supportive care like sterile neti pot, saline nasal spray, Tylenol, rest. I also advised if he needs help locating these he can ask pharmacy to help point him in right direction. The patient verbalized understanding and gratitude. Bird Tailor PA-C

## 2018-03-12 LAB — CUP PACEART REMOTE DEVICE CHECK
Date Time Interrogation Session: 20190828171810
Implantable Lead Implant Date: 20190121
Implantable Lead Location: 753860
Implantable Lead Model: 293
Implantable Lead Serial Number: 435292
Implantable Pulse Generator Implant Date: 20190121
Pulse Gen Serial Number: 241142

## 2018-03-24 ENCOUNTER — Telehealth: Payer: Self-pay | Admitting: Cardiology

## 2018-03-24 MED ORDER — APIXABAN 5 MG PO TABS: 5.0000 mg | ORAL_TABLET | Freq: Two times a day (BID) | ORAL | 3 refills | Status: DC

## 2018-03-24 MED ORDER — APIXABAN 5 MG PO TABS: 5.0000 mg | ORAL_TABLET | Freq: Two times a day (BID) | ORAL | 10 refills | Status: DC

## 2018-03-24 NOTE — Telephone Encounter (Addendum)
Eliquis 5mg  refill request received; pt is 32 yrs old, wt-165kg, Crea-1.27 on 02/14/18, last seen by Dr. [name] on 02/14/18; will send in refill to requested pharmacy.  Original request with refills, corrected and sent in 90 day supply with refills.

## 2018-03-24 NOTE — Telephone Encounter (Signed)
New Message    *STAT* If patient is at the pharmacy, call can be transferred to refill team.   1. Which medications need to be refilled? (please list name of each medication and dose if known) apixaban (ELIQUIS) 5 MG TABS tablet  2. Which pharmacy/location (including street and city if local pharmacy) is medication to be sent to? [facility] - [place], [place] - [address]  3. Do they need a 30 day or 90 day supply? 90 day supply

## 2018-03-24 NOTE — Addendum Note (Signed)
Addended by: Pamala Hurry B on: 03/24/2018 04:59 PM   Modules accepted: Orders

## 2018-04-16 ENCOUNTER — Telehealth: Payer: Self-pay | Admitting: Cardiology

## 2018-04-16 ENCOUNTER — Other Ambulatory Visit: Payer: Self-pay | Admitting: Cardiology

## 2018-04-16 MED ORDER — SPIRONOLACTONE 25 MG PO TABS: 12.5000 mg | ORAL_TABLET | Freq: Every day | ORAL | 3 refills | Status: DC

## 2018-04-16 MED ORDER — ATORVASTATIN CALCIUM 20 MG PO TABS: 20.0000 mg | ORAL_TABLET | Freq: Every day | ORAL | 3 refills | Status: DC

## 2018-04-16 NOTE — Telephone Encounter (Signed)
°*  STAT* If patient is at the pharmacy, call can be transferred to refill team.   1. Which medications need to be refilled? (please list name of each medication and dose if known)  Lipitor 25mg   and  Spironolactone 25mg   2. Which pharmacy/location (including street and city if local pharmacy) is medication t o be sent to? [facility] in [place]   3. Do they need a 30 day or 90 day supply? 90 days pt stated ran out today.

## 2018-04-16 NOTE — Telephone Encounter (Signed)
Completed as requested

## 2018-04-17 ENCOUNTER — Telehealth: Payer: Self-pay | Admitting: Cardiology

## 2018-04-17 MED ORDER — APIXABAN 5 MG PO TABS: 5.0000 mg | ORAL_TABLET | Freq: Two times a day (BID) | ORAL | 10 refills | Status: DC

## 2018-04-17 NOTE — Telephone Encounter (Signed)
New Message     *STAT* If patient is at the pharmacy, call can be transferred to refill team.   1. Which medications need to be refilled? (please list name of each medication and dose if known) apixaban (ELIQUIS) 5 MG TABS tablet  2. Which pharmacy/location (including street and city if local pharmacy) is medication to be sent to? [facility] - [place], [place] - [address]  3. Do they need a 30 day or 90 day supply? 30

## 2018-04-17 NOTE — Telephone Encounter (Signed)
Eliquis 5mg  refill request received; pt is 32 yrs old, wt-165kg, Crea-1.27 on 02/14/18, last seen by Dr. [name] on 02/14/18; will send in a refill to requested pharmacy & for requested amount of 30 days with refills.

## 2018-05-19 ENCOUNTER — Ambulatory Visit (INDEPENDENT_AMBULATORY_CARE_PROVIDER_SITE_OTHER): Payer: Self-pay | Admitting: *Deleted

## 2018-05-19 ENCOUNTER — Telehealth: Payer: Self-pay | Admitting: Cardiology

## 2018-05-19 DIAGNOSIS — I428 Other cardiomyopathies: Secondary | ICD-10-CM

## 2018-05-19 NOTE — Telephone Encounter (Signed)
Patients called to make sure paperwork was received for his Eliquis and ENTRESTO.

## 2018-05-19 NOTE — Telephone Encounter (Signed)
The pt is advised that I did not receive his Eliquis pt assistance application. He states that he faxed it to [name] a while back.  He states that he will re-fax his Eliquis application to [name] along with the fax number to send his Entresto prescription that he states [employer] told him they need.

## 2018-05-19 NOTE — Progress Notes (Signed)
Remote ICD transmission.   

## 2018-05-22 ENCOUNTER — Encounter: Payer: Self-pay | Admitting: Cardiology

## 2018-06-09 NOTE — Telephone Encounter (Signed)
The pt is advised that I have received the provider part of the [employer] pt asst application, I have completed it, Dr [name] has signed it and I faxed it back to [employer].  The pt states that he took care of his part of the application and that [employer] already has it and is only needing the providers part.

## 2018-06-09 NOTE — Telephone Encounter (Signed)
**Note De-Identified  Obfuscation** The pt states that someone from [employer] advised him that we faxed them the provide part of his application with the wrong date on it.  I advised the pt that I have not faxed anything to [employer] concerning his application since Jan. of this year.   He states that he will call [employer] back to get a better understanding of what they need and that he will call me back.

## 2018-06-09 NOTE — Telephone Encounter (Signed)
New Message:     Please call, concerning the paper work filled out for his Eliquis.

## 2018-06-09 NOTE — Telephone Encounter (Signed)
Follow up   Pt calling, states [employer] faxed papers to be sign by [employer]. Please call

## 2018-06-10 NOTE — Telephone Encounter (Signed)
**Note De-Identified Markiyah Gahm Obfuscation** Letter received from [employer] stating that they have approved the pt for pt assistance with Entresto. Approval good until 06/10/2019. Pt. ID: [id number]

## 2018-07-17 ENCOUNTER — Encounter: Payer: Self-pay | Admitting: Nurse Practitioner

## 2018-07-18 LAB — CUP PACEART REMOTE DEVICE CHECK
Date Time Interrogation Session: 20200103191827
Implantable Lead Model: 293
Implantable Lead Serial Number: 435292
MDC IDC LEAD IMPLANT DT: 20190121
MDC IDC LEAD LOCATION: 753860
MDC IDC PG IMPLANT DT: 20190121
Pulse Gen Serial Number: 241142

## 2018-08-12 ENCOUNTER — Encounter: Payer: Self-pay | Admitting: Internal Medicine

## 2018-08-12 ENCOUNTER — Ambulatory Visit: Payer: Self-pay | Admitting: Nurse Practitioner

## 2018-08-12 ENCOUNTER — Ambulatory Visit (INDEPENDENT_AMBULATORY_CARE_PROVIDER_SITE_OTHER): Payer: Self-pay | Admitting: Internal Medicine

## 2018-08-12 ENCOUNTER — Encounter (INDEPENDENT_AMBULATORY_CARE_PROVIDER_SITE_OTHER): Payer: Self-pay

## 2018-08-12 VITALS — BP 150/110 | HR 69 | Ht 69.0 in | Wt 349.6 lb

## 2018-08-12 DIAGNOSIS — Z95810 Presence of automatic (implantable) cardiac defibrillator: Secondary | ICD-10-CM

## 2018-08-12 DIAGNOSIS — I5022 Chronic systolic (congestive) heart failure: Secondary | ICD-10-CM

## 2018-08-12 NOTE — Progress Notes (Signed)
HPI Mr. Bitterman returns today for ongoing evaluation of his ICD. He is a pleasant morbidly obese 33 yo man with chronic systolic heart failure, HTN, s/p ICD insertion. He has been stable in the interim but has not gone back to work. He was told that he was not able to keep up and has been looking for work.    No Known Allergies   Current Outpatient Medications  Medication Sig Dispense Refill  . acetaminophen (TYLENOL) 325 MG tablet Take 2 tablets (650 mg total) by mouth every 4 (four) hours as needed for headache or mild pain.    Marland Kitchen apixaban (ELIQUIS) 5 MG TABS tablet Take 1 tablet (5 mg total) by mouth 2 (two) times daily. 30 tablet 10  . atorvastatin (LIPITOR) 20 MG tablet Take 1 tablet (20 mg total) by mouth daily at 6 PM. 90 tablet 3  . carvedilol (COREG) 25 MG tablet TAKE 1 & 1/2 (ONE & ONE-HALF) TABLETS (37.5 MG TOTAL) BY MOUTH TWICE DAILY WITH MEALS 270 tablet 3  . isosorbide mononitrate (IMDUR) 30 MG 24 hr tablet Take 1 tablet (30 mg total) by mouth daily. 90 tablet 3  . sacubitril-valsartan (ENTRESTO) 97-103 MG Take 1 tablet by mouth 2 (two) times daily. 60 tablet 11  . spironolactone (ALDACTONE) 25 MG tablet Take 0.5 tablets (12.5 mg total) by mouth daily. 45 tablet 3   No current facility-administered medications for this visit.      Past Medical History:  Diagnosis Date  . [employer] ICD (implantable cardioverter-defibrillator) in place   . Childhood asthma   . CKD (chronic kidney disease)    "left kidney weaker than right" (03/13/2017)  . Hypertension   . NSTEMI (non-ST elevated myocardial infarction) (HCC) 03/13/2017   [name] 03/13/2017  . PAF (paroxysmal atrial fibrillation) (HCC)   . Renal insufficiency     ROS:   All systems reviewed and negative except as noted in the HPI.   Past Surgical History:  Procedure Laterality Date  . ICD IMPLANT N/A 08/05/2017   Procedure: ICD IMPLANT;  Surgeon: Marinus Maw, MD;  Location: [facility] INVASIVE CV LAB;   Service: Cardiovascular;  Laterality: N/A;  . LEFT HEART CATH AND CORONARY ANGIOGRAPHY N/A 03/14/2017   Procedure: LEFT HEART CATH AND CORONARY ANGIOGRAPHY;  Surgeon: Swaziland, Peter M, MD;  Location: [facility] INVASIVE CV LAB;  Service: Cardiovascular;  Laterality: N/A;  . NO PAST SURGERIES       Family History  Problem Relation Age of Onset  . Hypertension Mother   . Hypertension Father      Social History   Socioeconomic History  . Marital status: Single    Spouse name: Not on file  . Number of children: Not on file  . Years of education: Not on file  . Highest education level: Not on file  Occupational History  . Not on file  Social Needs  . Financial resource strain: Not on file  . Food insecurity:    Worry: Not on file    Inability: Not on file  . Transportation needs:    Medical: Not on file    Non-medical: Not on file  Tobacco Use  . Smoking status: Former Smoker    Years: 2.00    Types: Cigarettes  . Smokeless tobacco: Never Used  . Tobacco comment: 03/13/2017 "quit when I was 11 or 12"  Substance and Sexual Activity  . Alcohol use: Yes    Comment: 03/13/2017 "I drink on my birthday;  nothing else"  . Drug use: No  . Sexual activity: Not on file  Lifestyle  . Physical activity:    Days per week: Not on file    Minutes per session: Not on file  . Stress: Not on file  Relationships  . Social connections:    Talks on phone: Not on file    Gets together: Not on file    Attends religious service: Not on file    Active member of club or organization: Not on file    Attends meetings of clubs or organizations: Not on file    Relationship status: Not on file  . Intimate partner violence:    Fear of current or ex partner: Not on file    Emotionally abused: Not on file    Physically abused: Not on file    Forced sexual activity: Not on file  Other Topics Concern  . Not on file  Social History Narrative  . Not on file     BP (!) 150/110   Pulse 69   Ht 5\' 9"   (1.753 m)   Wt (!) 349 lb 9.6 oz (158.6 kg)   SpO2 99%   BMI 51.63 kg/m   Physical Exam:  Morbidly obese appearing NAD HEENT: Unremarkable Neck:  6 cm JVD, no thyromegally Lymphatics:  No adenopathy Back:  No CVA tenderness Lungs:  Clear with no wheezes HEART:  Regular rate rhythm, no murmurs, no rubs, no clicks Abd:  soft, positive bowel sounds, no organomegally, no rebound, no guarding Ext:  2 plus pulses, no edema, no cyanosis, no clubbing Skin:  No rashes no nodules Neuro:  CN II through XII intact, motor grossly intact  EKG - nsr  DEVICE  Normal device function.  See PaceArt for details.   Assess/Plan: 1. Chronic systolic heart failure - His symptoms remain class 2. He will continue his current meds. 2. Obesity - His weight is down a couple of pounds and I have strongly encouraged him to work on this. 3. ICD - His [employer] Sci device has 14 years of battery longevity. He has only had NSVT. Asymptomatic.  4. PAF - he is maintaining NSR . He will continue Eliquis.  Leonia Reeves.D.

## 2018-08-12 NOTE — Progress Notes (Deleted)
CARDIOLOGY OFFICE NOTE  Date:  08/12/2018    [name] Date of Birth: [birth year] Medical Record #[id number]  PCP:  System, Pcp Not In  Cardiologist:  Tyrone Sage & ***    No chief complaint on file.   History of Present Illness: Richard Cochran is a 33 y.o. 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 ECHO 03/14/17 and 07/15/17 showing EF once again of 10-15%. Likely HTN cardiomyopathy.  No shock from LifeVest.  No CAD on cardiac catheterization  Blood pressure much better controlled, 107/85 at home.   Sleep study on hold because of no insurance.   Family member in the room works in the device clinic at [facility]  02/14/2018- no orthopnea no PND.  He does have apnea-like symptoms at night his significant other states.  Cannot afford sleep study because he does not have insurance.  It would cost him $2000 out of pocket.  Overall no chest pain.  He has excellent battery life on his ICD.  Dr. [name] is monitoring.  He had atrial fibrillation in the hospital setting.  He is on Eliquis.  No bleeding.   Comes in today. Here with   Past Medical History:  Diagnosis Date  . [employer] ICD (implantable cardioverter-defibrillator) in place   . Childhood asthma   . CKD (chronic kidney disease)    "left kidney weaker than right" (03/13/2017)  . Hypertension   . NSTEMI (non-ST elevated myocardial infarction) (HCC) 03/13/2017   [name] 03/13/2017  . PAF (paroxysmal atrial fibrillation) (HCC)   . Renal insufficiency     Past Surgical History:  Procedure Laterality Date  . ICD IMPLANT N/A 08/05/2017   Procedure: ICD IMPLANT;  Surgeon: Marinus Maw, MD;  Location: [facility] INVASIVE CV LAB;  Service: Cardiovascular;  Laterality: N/A;  . LEFT HEART CATH AND CORONARY ANGIOGRAPHY N/A 03/14/2017   Procedure: LEFT HEART CATH AND CORONARY ANGIOGRAPHY;  Surgeon: Swaziland, Peter M, MD;  Location: [facility] INVASIVE CV LAB;  Service:  Cardiovascular;  Laterality: N/A;  . NO PAST SURGERIES       Medications: No outpatient medications have been marked as taking for the 08/12/18 encounter (Appointment) with Rosalio Macadamia, NP.     Allergies: No Known Allergies  Social History: The patient  reports that he has quit smoking. His smoking use included cigarettes. He quit after 2.00 years of use. He has never used smokeless tobacco. He reports current alcohol use. He reports that he does not use drugs.   Family History: The patient's ***family history includes Hypertension in his father and mother.   Review of Systems: Please see the history of present illness.   Otherwise, the review of systems is positive for {NONE DEFAULTED:18576::"none"}.   All other systems are reviewed and negative.   Physical Exam: VS:  There were no vitals taken for this visit. Marland Kitchen  BMI There is no height or weight on file to calculate BMI.  Wt Readings from Last 3 Encounters:  02/14/18 (!) 363 lb 12.8 oz (165 kg)  11/08/17 (!) 358 lb (162.4 kg)  11/08/17 (!) 359 lb (162.8 kg)    General: Pleasant. Well developed, well nourished and in no acute distress.   HEENT: Normal.  Neck: Supple, no JVD, carotid bruits, or masses noted.  Cardiac: ***Regular rate and rhythm. No murmurs, rubs, or gallops. No edema.  Respiratory:  Lungs are clear to auscultation bilaterally with normal work of breathing.  GI: Soft and  nontender.  MS: No deformity or atrophy. Gait and ROM intact.  Skin: Warm and dry. Color is normal.  Neuro:  Strength and sensation are intact and no gross focal deficits noted.  Psych: Alert, appropriate and with normal affect.   LABORATORY DATA:  EKG:  EKG {ACTION; IS/IS [id number]} ordered today. This demonstrates ***.  Lab Results  Component Value Date   WBC 9.3 02/14/2018   HGB 13.1 02/14/2018   HCT 41.1 02/14/2018   PLT 227 02/14/2018   GLUCOSE 92 02/14/2018   CHOL 175 03/14/2017   TRIG 88 03/14/2017   HDL 53  03/14/2017   LDLCALC 104 (H) 03/14/2017   NA 141 02/14/2018   K 4.7 02/14/2018   CL 102 02/14/2018   CREATININE 1.27 02/14/2018   BUN 16 02/14/2018   CO2 24 02/14/2018   TSH 2.034 03/15/2017   INR 1.05 03/14/2017   HGBA1C 5.2 03/13/2017     BNP (last 3 results) No results for input(s): BNP in the last 8760 hours.  ProBNP (last 3 results) No results for input(s): PROBNP in the last 8760 hours.   Other Studies Reviewed Today:   Assessment/Plan: 1. Essential hypertension   2. PAF (paroxysmal atrial fibrillation) (HCC)   3. Medication management    PLAN:    In order of problems listed above:  Dilated cardiomyopathy/nonischemic cardiomyopathy -Likely hypertensive in origin given his many years of elevated blood pressure.  Despite medications over the last few months and normal blood pressure control, his ejection fraction remains low at 10-15%.  We will refer him to electrophysiology for defibrillator.  Narrow complex QRS.  Currently NYHA class II -Continue medications as above.  Appreciate pharmacy assistance with titration of medications.  Excellent.   Paroxysmal atrial fibrillation - In the hospital was noted to have atrial fibrillation generally in the 80s in the 90s.  Eliquis.  Encouraged sleep study however he cannot afford it without medical insurance.  Elevate pillows.  Morbid obesity -Continue to encourage weight loss, no significant changes.   Current medicines are reviewed with the patient today.  The patient does not have concerns regarding medicines other than what has been noted above.  The following changes have been made:  See above.  Labs/ tests ordered today include:   No orders of the defined types were placed in this encounter.    Disposition:   FU with *** in {gen number 3-81:[id number]} {Days to years:10300}.   Patient is agreeable to this plan and will call if any problems develop in the interim.   SignedNorma Fredrickson, NP  08/12/2018  7:31 AM  [facility] [address] [place], [place]  [address] Phone: [phone] Fax: [phone]

## 2018-08-12 NOTE — Patient Instructions (Addendum)
Medication Instructions:  Your physician recommends that you continue on your current medications as directed. Please refer to the Current Medication list given to you today.  Labwork: None ordered.  Testing/Procedures: None ordered.  Follow-Up:   Your physician wants you to follow-up in: one year with Dr. [name].   You will receive a reminder letter in the mail two months in advance. If you don't receive a letter, please call our office to schedule the follow-up appointment.  Remote monitoring is used to monitor your ICD from home. This monitoring reduces the number of office visits required to check your device to one time per year. It allows [name] to keep an eye on the functioning of your device to ensure it is working properly. You are scheduled for a device check from home on 08/18/2018. You may send your transmission at any time that day. If you have a wireless device, the transmission will be sent automatically. After your physician reviews your transmission, you will receive a postcard with your next transmission date.  Any Other Special Instructions Will Be Listed Below (If Applicable).  If you need a refill on your cardiac medications before your next appointment, please call your pharmacy.

## 2018-08-13 ENCOUNTER — Ambulatory Visit: Payer: Self-pay | Admitting: Nurse Practitioner

## 2018-08-13 LAB — CUP PACEART INCLINIC DEVICE CHECK
Date Time Interrogation Session: 20200128050000
HighPow Impedance: 70 Ohm
Implantable Lead Implant Date: 20190121
Implantable Lead Location: 753860
Implantable Lead Model: 293
Implantable Pulse Generator Implant Date: 20190121
Lead Channel Impedance Value: 513 Ohm
Lead Channel Pacing Threshold Pulse Width: 0.4 ms
Lead Channel Sensing Intrinsic Amplitude: 25 mV
Lead Channel Setting Pacing Amplitude: 3.5 V
Lead Channel Setting Pacing Pulse Width: 0.4 ms
Lead Channel Setting Sensing Sensitivity: 0.5 mV
MDC IDC LEAD SERIAL: 435292
MDC IDC MSMT LEADCHNL RV PACING THRESHOLD AMPLITUDE: 0.8 V
Pulse Gen Serial Number: 241142

## 2018-08-18 ENCOUNTER — Ambulatory Visit (INDEPENDENT_AMBULATORY_CARE_PROVIDER_SITE_OTHER): Payer: Self-pay

## 2018-08-18 DIAGNOSIS — I428 Other cardiomyopathies: Secondary | ICD-10-CM

## 2018-08-18 DIAGNOSIS — I5022 Chronic systolic (congestive) heart failure: Secondary | ICD-10-CM

## 2018-08-20 LAB — CUP PACEART REMOTE DEVICE CHECK
Battery Remaining Longevity: 168 mo
Battery Remaining Percentage: 100 %
Brady Statistic RV Percent Paced: 0 %
Date Time Interrogation Session: 20200203051200
HighPow Impedance: 68 Ohm
Implantable Lead Implant Date: 20190121
Implantable Lead Location: 753860
Implantable Lead Model: 293
Implantable Lead Serial Number: 435292
Implantable Pulse Generator Implant Date: 20190121
Lead Channel Impedance Value: 487 Ohm
Lead Channel Setting Pacing Amplitude: 2.5 V
Lead Channel Setting Pacing Pulse Width: 0.4 ms
Lead Channel Setting Sensing Sensitivity: 0.5 mV
Pulse Gen Serial Number: 241142

## 2018-08-26 NOTE — Progress Notes (Signed)
Remote ICD transmission.   

## 2018-09-26 ENCOUNTER — Telehealth: Payer: Self-pay

## 2018-09-26 NOTE — Telephone Encounter (Signed)
Received notification from [employer] that Pt is approved for Pt Asst for Eliquis. He is eligible to receive Eliquis free of charge from 09/25/2018 through 09/25/2019.

## 2018-11-24 ENCOUNTER — Telehealth: Payer: Self-pay

## 2018-11-24 ENCOUNTER — Other Ambulatory Visit: Payer: Self-pay

## 2018-11-24 ENCOUNTER — Encounter: Payer: Self-pay | Admitting: *Deleted

## 2018-11-24 NOTE — Telephone Encounter (Signed)
Left message for patient to remind of missed remote transmission.  

## 2018-12-03 ENCOUNTER — Other Ambulatory Visit: Payer: Self-pay

## 2018-12-03 MED ORDER — ISOSORBIDE MONONITRATE ER 30 MG PO TB24: 30.0000 mg | ORAL_TABLET | Freq: Every day | ORAL | 1 refills | Status: DC

## 2018-12-23 ENCOUNTER — Other Ambulatory Visit: Payer: Self-pay | Admitting: Cardiology

## 2018-12-23 ENCOUNTER — Telehealth: Payer: Self-pay | Admitting: Cardiology

## 2018-12-23 MED ORDER — SACUBITRIL-VALSARTAN 97-103 MG PO TABS: 1.0000 | ORAL_TABLET | Freq: Two times a day (BID) | ORAL | 2 refills | Status: DC

## 2018-12-23 NOTE — Telephone Encounter (Signed)
New Message: ° ° ° ° °Please call. °

## 2018-12-23 NOTE — Telephone Encounter (Signed)
°*  STAT* If patient is at the pharmacy, call can be transferred to refill team.   1. Which medications need to be refilled? (please list name of each medication and dose if known) need new prescription for Entresto- Please call pt if possible to let him know when you faxed this in.  2. Which pharmacy/location (including street and city if local pharmacy) is medication to be sent to?Please fax to [facility]- [phone]   3. Pt need a 30 day or 90 day supply? [place]

## 2018-12-23 NOTE — Telephone Encounter (Signed)
Pt's medication was sent to pt's pharmacy as requested. Confirmation received.  °

## 2018-12-23 NOTE — Telephone Encounter (Signed)
LMOM for pt to return call. No info left about nature of pts questions when he rquested call back from office.

## 2018-12-23 NOTE — Telephone Encounter (Signed)
Pt informed Device Clinic that he has moved to [facility] but will be continuing to receive care with [facility]. Pt missed last remote transmission in 5/20 . Pt haas contacted [employer] and will be receiving additional piece of equipment to allow remote home transmissions from area he lives due to [place] of cell phone coverage. Pt to contact clinic when he sends next transmission to verify it was received.

## 2018-12-26 ENCOUNTER — Other Ambulatory Visit: Payer: Self-pay | Admitting: Cardiology

## 2018-12-26 MED ORDER — SACUBITRIL-VALSARTAN 97-103 MG PO TABS: 1.0000 | ORAL_TABLET | Freq: Two times a day (BID) | ORAL | 3 refills | Status: DC

## 2018-12-26 NOTE — Telephone Encounter (Signed)
Fax number [phone]

## 2018-12-26 NOTE — Telephone Encounter (Signed)
New Message      *STAT* If patient is at the pharmacy, call can be transferred to refill team.   1. Which medications need to be refilled? (please list name of each medication and dose if known) Entresto  2. Which pharmacy/location (including street and city if local pharmacy) is medication to be sent to? Send to company, he says he needs it faxed  Call [phone]  3. Do they need a 30 day or 90 day supply? 30 or 90

## 2018-12-26 NOTE — Telephone Encounter (Signed)
Pt's medication was faxed to [employer] patient assistance program faxed # [phone], confirmation received.

## 2019-01-13 ENCOUNTER — Telehealth: Payer: Self-pay | Admitting: Cardiology

## 2019-01-13 NOTE — Telephone Encounter (Signed)
New Message ° ° ° °1. Has your device fired? no ° °2. Is you device beeping? no ° °3. Are you experiencing draining or swelling at device site? no ° °4. Are you calling to see if we received your device transmission? yes ° °5. Have you passed out? No  ° ° ° °Please route to Device Clinic Pool ° °

## 2019-01-13 NOTE — Telephone Encounter (Signed)
LMOM that transmission received.

## 2019-01-14 ENCOUNTER — Ambulatory Visit (INDEPENDENT_AMBULATORY_CARE_PROVIDER_SITE_OTHER): Payer: Self-pay | Admitting: *Deleted

## 2019-01-14 ENCOUNTER — Telehealth: Payer: Self-pay | Admitting: Student

## 2019-01-14 DIAGNOSIS — I428 Other cardiomyopathies: Secondary | ICD-10-CM

## 2019-01-14 NOTE — Telephone Encounter (Signed)
Pt called to ask about his device transmission. It went through successfully and nothing concerning. (Occasional NSVT, but asymptomatic, and same previously)   Legrand Como "[employer], PA-C 01/14/2019 8:26 AM

## 2019-01-15 LAB — CUP PACEART REMOTE DEVICE CHECK
Battery Remaining Longevity: 168 mo
Brady Statistic RV Percent Paced: 0 %
Date Time Interrogation Session: 20200702072833
HighPow Impedance: 71 Ohm
Implantable Lead Implant Date: 20190121
Implantable Lead Location: 753860
Implantable Lead Model: 293
Implantable Lead Serial Number: 435292
Implantable Pulse Generator Implant Date: 20190121
Lead Channel Impedance Value: 479 Ohm
Lead Channel Sensing Intrinsic Amplitude: 25 mV
Lead Channel Setting Pacing Amplitude: 2.5 V
Lead Channel Setting Pacing Pulse Width: 0.4 ms
Lead Channel Setting Sensing Sensitivity: 0.5 mV
Pulse Gen Serial Number: 241142

## 2019-01-20 ENCOUNTER — Other Ambulatory Visit: Payer: Self-pay | Admitting: Cardiology

## 2019-01-20 NOTE — Telephone Encounter (Signed)
Pt's medication was sent to pt's pharmacy as requested. Confirmation received.  °

## 2019-01-23 ENCOUNTER — Encounter: Payer: Self-pay | Admitting: Cardiology

## 2019-01-23 NOTE — Progress Notes (Signed)
Remote ICD transmission.   

## 2019-03-31 ENCOUNTER — Ambulatory Visit (INDEPENDENT_AMBULATORY_CARE_PROVIDER_SITE_OTHER): Payer: Self-pay | Admitting: Cardiology

## 2019-03-31 ENCOUNTER — Other Ambulatory Visit: Payer: Self-pay

## 2019-03-31 ENCOUNTER — Encounter: Payer: Self-pay | Admitting: Cardiology

## 2019-03-31 VITALS — BP 140/90 | HR 73 | Ht 69.0 in | Wt 336.6 lb

## 2019-03-31 DIAGNOSIS — Z79899 Other long term (current) drug therapy: Secondary | ICD-10-CM

## 2019-03-31 DIAGNOSIS — I5042 Chronic combined systolic (congestive) and diastolic (congestive) heart failure: Secondary | ICD-10-CM

## 2019-03-31 DIAGNOSIS — E785 Hyperlipidemia, unspecified: Secondary | ICD-10-CM

## 2019-03-31 DIAGNOSIS — I480 Paroxysmal atrial fibrillation: Secondary | ICD-10-CM

## 2019-03-31 DIAGNOSIS — I10 Essential (primary) hypertension: Secondary | ICD-10-CM

## 2019-03-31 LAB — BASIC METABOLIC PANEL
BUN/Creatinine Ratio: 10 (ref 9–20)
BUN: 13 mg/dL (ref 6–20)
CO2: 24 mmol/L (ref 20–29)
Calcium: 9.1 mg/dL (ref 8.7–10.2)
Chloride: 103 mmol/L (ref 96–106)
Creatinine, Ser: 1.3 mg/dL — ABNORMAL HIGH (ref 0.76–1.27)
GFR calc Af Amer: 82 mL/min/{1.73_m2} (ref >59)
GFR calc non Af Amer: 71 mL/min/{1.73_m2} (ref >59)
Glucose: 102 mg/dL — ABNORMAL HIGH (ref 65–99)
Potassium: 4.7 mmol/L (ref 3.5–5.2)
Sodium: 138 mmol/L (ref 134–144)

## 2019-03-31 LAB — LIPID PANEL
Chol/HDL Ratio: 2.8 ratio (ref 0.0–5.0)
Cholesterol, Total: 130 mg/dL (ref 100–199)
HDL: 46 mg/dL (ref >39)
LDL Chol Calc (NIH): 67 mg/dL (ref 0–99)
Triglycerides: 90 mg/dL (ref 0–149)
VLDL Cholesterol Cal: 17 mg/dL (ref 5–40)

## 2019-03-31 LAB — CBC
Hematocrit: 42.4 % (ref 37.5–51.0)
Hemoglobin: 13.6 g/dL (ref 13.0–17.7)
MCH: 28.3 pg (ref 26.6–33.0)
MCHC: 32.1 g/dL (ref 31.5–35.7)
MCV: 88 fL (ref 79–97)
Platelets: 250 10*3/uL (ref 150–450)
RBC: 4.8 x10E6/uL (ref 4.14–5.80)
RDW: 13.9 % (ref 11.6–15.4)
WBC: 9.2 10*3/uL (ref 3.4–10.8)

## 2019-03-31 LAB — ALT: ALT: 13 IU/L (ref 0–44)

## 2019-03-31 MED ORDER — SACUBITRIL-VALSARTAN 97-103 MG PO TABS: 1.0000 | ORAL_TABLET | Freq: Two times a day (BID) | ORAL | 3 refills | Status: DC

## 2019-03-31 MED ORDER — ISOSORBIDE MONONITRATE ER 30 MG PO TB24: 30.0000 mg | ORAL_TABLET | Freq: Every day | ORAL | 3 refills | Status: DC

## 2019-03-31 MED ORDER — CARVEDILOL 25 MG PO TABS: 37.5000 mg | ORAL_TABLET | Freq: Two times a day (BID) | ORAL | 3 refills | Status: DC

## 2019-03-31 MED ORDER — SPIRONOLACTONE 25 MG PO TABS: 12.5000 mg | ORAL_TABLET | Freq: Every day | ORAL | 3 refills | Status: DC

## 2019-03-31 MED ORDER — APIXABAN 5 MG PO TABS: 5.0000 mg | ORAL_TABLET | Freq: Two times a day (BID) | ORAL | 11 refills | Status: DC

## 2019-03-31 MED ORDER — ATORVASTATIN CALCIUM 20 MG PO TABS: 20.0000 mg | ORAL_TABLET | Freq: Every day | ORAL | 3 refills | Status: DC

## 2019-03-31 NOTE — Progress Notes (Signed)
Cardiology Office Note:    Date:  03/31/2019   ID:  Richard Cochran, DOB 29-Aug-1985, MRN [id number]  PCP:  System, Pcp Not In  Cardiologist:  Candee Furbish, MD    Referring MD: No ref. provider found     History of Present Illness:    Richard Cochran is a 33 y.o. 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 ECHO 03/14/17 and 07/15/17 showing EF once again of 10-15%. Likely HTN cardiomyopathy.  No shock from LifeVest.  No CAD on cardiac catheterization  Blood pressure much better controlled, 107/85 at home.   Sleep study on hold because of no insurance.   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  02/14/2018- no orthopnea no PND.  He does have apnea-like symptoms at night his significant other states.  Cannot afford sleep study because he does not have insurance.  It would cost him $2000 out of pocket.  Overall no chest pain.  He has excellent battery life on his ICD.  Dr. Lovena Le is monitoring.  He had atrial fibrillation in the hospital setting.  He is on Eliquis.  No bleeding.  03/31/2019-here for the follow-up of cardiomyopathy-last EF was 15%.  Defibrillator in place.  On goal-directed therapy with medications as below.  He is tolerating these well.  Occasionally when bending over and getting up or getting up quickly he may feel some transient dizziness.  No syncope.  Tolerating the meds well.  No bleeding fevers chills nausea vomiting syncope.  He is now living in [place] helping to take care of his child.  Past Medical History:  Diagnosis Date  . [employer] ICD (implantable cardioverter-defibrillator) in place   . Childhood asthma   . CKD (chronic kidney disease)    "left kidney weaker than right" (03/13/2017)  . Hypertension   . NSTEMI (non-ST elevated myocardial infarction) ([place]) 03/13/2017   Archie Endo 03/13/2017  . PAF (paroxysmal atrial fibrillation) ([place])   . Renal insufficiency     Past Surgical History:  Procedure  Laterality Date  . ICD IMPLANT N/A 08/05/2017   Procedure: ICD IMPLANT;  Surgeon: Evans Lance, MD;  Location: [place] CV LAB;  Service: Cardiovascular;  Laterality: N/A;  . LEFT HEART CATH AND CORONARY ANGIOGRAPHY N/A 03/14/2017   Procedure: LEFT HEART CATH AND CORONARY ANGIOGRAPHY;  Surgeon: Martinique, Peter M, MD;  Location: [place] CV LAB;  Service: Cardiovascular;  Laterality: N/A;  . NO PAST SURGERIES      Current Medications: Current Meds  Medication Sig  . acetaminophen (TYLENOL) 325 MG tablet Take 2 tablets (650 mg total) by mouth every 4 (four) hours as needed for headache or mild pain.  Marland Kitchen apixaban (ELIQUIS) 5 MG TABS tablet Take 1 tablet (5 mg total) by mouth 2 (two) times daily.  Marland Kitchen atorvastatin (LIPITOR) 20 MG tablet Take 1 tablet (20 mg total) by mouth daily at 6 PM.  . carvedilol (COREG) 25 MG tablet Take 1.5 tablets (37.5 mg total) by mouth 2 (two) times daily with a meal.  . isosorbide mononitrate (IMDUR) 30 MG 24 hr tablet Take 1 tablet (30 mg total) by mouth daily.  . sacubitril-valsartan (ENTRESTO) 97-103 MG Take 1 tablet by mouth 2 (two) times daily.  Marland Kitchen spironolactone (ALDACTONE) 25 MG tablet Take 0.5 tablets (12.5 mg total) by mouth daily.  . [DISCONTINUED] apixaban (ELIQUIS) 5 MG TABS tablet Take 1 tablet (5 mg total) by mouth 2 (two) times daily.  . [DISCONTINUED] atorvastatin (LIPITOR)  20 MG tablet Take 1 tablet (20 mg total) by mouth daily at 6 PM.  . [DISCONTINUED] carvedilol (COREG) 25 MG tablet Take 1.5 tablets (37.5 mg total) by mouth 2 (two) times daily with a meal. Please make yearly appt with Dr. [name] for August before anymore refills. 1st attempt  . [DISCONTINUED] isosorbide mononitrate (IMDUR) 30 MG 24 hr tablet Take 1 tablet (30 mg total) by mouth daily.  . [DISCONTINUED] sacubitril-valsartan (ENTRESTO) 97-103 MG Take 1 tablet by mouth 2 (two) times daily. Please make overdue appt with Dr. [name] before anymore refills. 1st attempt  . [DISCONTINUED]  spironolactone (ALDACTONE) 25 MG tablet Take 0.5 tablets (12.5 mg total) by mouth daily.     Allergies:   Patient has no known allergies.   Social History   Socioeconomic History  . Marital status: Single    Spouse name: Not on file  . Number of children: Not on file  . Years of education: Not on file  . Highest education level: Not on file  Occupational History  . Not on file  Social Needs  . Financial resource strain: Not on file  . Food insecurity    Worry: Not on file    Inability: Not on file  . Transportation needs    Medical: Not on file    Non-medical: Not on file  Tobacco Use  . Smoking status: Former Smoker    Years: 2.00    Types: Cigarettes  . Smokeless tobacco: Never Used  . Tobacco comment: 03/13/2017 "quit when I was 11 or 12"  Substance and Sexual Activity  . Alcohol use: Yes    Comment: 03/13/2017 "I drink on my birthday; nothing else"  . Drug use: No  . Sexual activity: Not on file  Lifestyle  . Physical activity    Days per week: Not on file    Minutes per session: Not on file  . Stress: Not on file  Relationships  . Social Musician on phone: Not on file    Gets together: Not on file    Attends religious service: Not on file    Active member of club or organization: Not on file    Attends meetings of clubs or organizations: Not on file    Relationship status: Not on file  Other Topics Concern  . Not on file  Social History Narrative  . Not on file     Family History: The patient's family history includes Hypertension in his father and mother. ROS:   Please see the history of present illness.   All other review of systems negative  EKGs/Labs/Other Studies Reviewed:    The following studies were reviewed today: Echocardiograms, cardiac catheterization, lab work, EKG  EKG: Normal sinus rhythm, LVH, no interventricular conduction   Recent Labs: No results found for requested labs within last 8760 hours.  Recent Lipid Panel     Component Value Date/Time   CHOL 175 03/14/2017 0214   TRIG 88 03/14/2017 0214   HDL 53 03/14/2017 0214   CHOLHDL 3.3 03/14/2017 0214   VLDL 18 03/14/2017 0214   LDLCALC 104 (H) 03/14/2017 0214    Physical Exam:    VS:  BP 140/90   Pulse 73   Ht 5\' 9"  (1.753 m)   Wt (!) 336 lb 9.6 oz (152.7 kg)   SpO2 98%   BMI 49.71 kg/m     Wt Readings from Last 3 Encounters:  03/31/19 (!) 336 lb 9.6 oz (152.7 kg)  08/12/18 (!) 349 lb 9.6 oz (158.6 kg)  02/14/18 (!) 363 lb 12.8 oz (165 kg)     GEN: Well nourished, well developed, in no acute distress, obese  HEENT: normal  Neck: no JVD, carotid bruits, or masses Cardiac: RRR; no murmurs, rubs, or gallops,no edema  Respiratory:  clear to auscultation bilaterally, normal work of breathing GI: soft, nontender, nondistended, + BS MS: no deformity or atrophy  Skin: warm and dry, no rash Neuro:  Alert and Oriented x 3, Strength and sensation are intact Psych: euthymic mood, full affect   ASSESSMENT:    1. Essential hypertension   2. Medication management   3. PAF (paroxysmal atrial fibrillation) (HCC)   4. Chronic combined systolic and diastolic CHF (congestive heart failure) (HCC)   5. Hyperlipidemia, unspecified hyperlipidemia type    PLAN:    In order of problems listed above:  Dilated cardiomyopathy/nonischemic cardiomyopathy -Likely hypertensive in origin given his many years of elevated blood pressure.  Despite medications over the last few months and normal blood pressure control, his ejection fraction remains low at 10-15%.  Has defibrillator.  Narrow complex QRS.  Currently NYHA class II -Continue medications as above.  Appreciate pharmacy assistance with titration of medications.  Excellent.  Checking lab work today.   Paroxysmal atrial fibrillation - In the hospital was noted to have atrial fibrillation generally in the 80s in the 90s.  Eliquis.  Encouraged sleep study however he cannot afford it without medical  insurance.  Elevate pillows.  Checking lab work.  EKG in January 2020 showed normal rhythm.  Morbid obesity -Continue to encourage weight loss, he is down about 30 pounds.  Medication Adjustments/Labs and Tests Ordered: Current medicines are reviewed at length with the patient today.  Concerns regarding medicines are outlined above.  Orders Placed This Encounter  Procedures  . CBC  . ALT  . Lipid panel  . Basic metabolic panel   Meds ordered this encounter  Medications  . atorvastatin (LIPITOR) 20 MG tablet    Sig: Take 1 tablet (20 mg total) by mouth daily at 6 PM.    Dispense:  90 tablet    Refill:  3  . carvedilol (COREG) 25 MG tablet    Sig: Take 1.5 tablets (37.5 mg total) by mouth 2 (two) times daily with a meal.    Dispense:  270 tablet    Refill:  3  . isosorbide mononitrate (IMDUR) 30 MG 24 hr tablet    Sig: Take 1 tablet (30 mg total) by mouth daily.    Dispense:  90 tablet    Refill:  3  . sacubitril-valsartan (ENTRESTO) 97-103 MG    Sig: Take 1 tablet by mouth 2 (two) times daily.    Dispense:  180 tablet    Refill:  3  . spironolactone (ALDACTONE) 25 MG tablet    Sig: Take 0.5 tablets (12.5 mg total) by mouth daily.    Dispense:  45 tablet    Refill:  3  . apixaban (ELIQUIS) 5 MG TABS tablet    Sig: Take 1 tablet (5 mg total) by mouth 2 (two) times daily.    Dispense:  60 tablet    Refill:  11    Signed, Donato SchultzMark Skains, MD  03/31/2019 10:09 AM    [facility]

## 2019-03-31 NOTE — Patient Instructions (Signed)
Medication Instructions:  The current medical regimen is effective;  continue present plan and medications.  If you need a refill on your cardiac medications before your next appointment, please call your pharmacy.   Lab work: Please have blood work today. (BMP, CBC, ALT and Lipid) If you have labs (blood work) drawn today and your tests are completely normal, you will receive your results only by: Marland Kitchen MyChart Message (if you have MyChart) OR . A paper copy in the mail If you have any lab test that is abnormal or we need to change your treatment, we will call you to review the results.  Follow-Up: At [facility], you and your health needs are our priority.  As part of our continuing mission to provide you with exceptional heart care, we have created designated Provider Care Teams.  These Care Teams include your primary Cardiologist (physician) and Advanced Practice Providers (APPs -  Physician Assistants and Nurse Practitioners) who all work together to provide you with the care you need, when you need it. You will need a follow up appointment in 12 months.  Please call our office 2 months in advance to schedule this appointment.  You may see Candee Furbish, MD or one of the following Advanced Practice Providers on your designated Care Team:   Truitt Merle, NP Cecilie Kicks, NP . Kathyrn Drown, NP  Thank you for choosing [facility]!!

## 2019-04-15 ENCOUNTER — Ambulatory Visit (INDEPENDENT_AMBULATORY_CARE_PROVIDER_SITE_OTHER): Payer: Self-pay | Admitting: *Deleted

## 2019-04-15 DIAGNOSIS — I428 Other cardiomyopathies: Secondary | ICD-10-CM

## 2019-04-15 LAB — CUP PACEART REMOTE DEVICE CHECK
Date Time Interrogation Session: 20200930092243
Implantable Lead Implant Date: 20190121
Implantable Lead Location: 753860
Implantable Lead Model: 293
Implantable Lead Serial Number: 435292
Implantable Pulse Generator Implant Date: 20190121
Pulse Gen Serial Number: 241142

## 2019-04-21 ENCOUNTER — Encounter: Payer: Self-pay | Admitting: Cardiology

## 2019-04-21 NOTE — Progress Notes (Signed)
Remote ICD transmission.   

## 2019-06-08 ENCOUNTER — Telehealth: Payer: Self-pay | Admitting: Cardiology

## 2019-06-08 NOTE — Telephone Encounter (Signed)
[employer] Patient Assistance renewal application received. Placed in Dr. Marlou Porch box for review. 06/08/19 vlm

## 2019-06-10 ENCOUNTER — Telehealth: Payer: Self-pay | Admitting: *Deleted

## 2019-06-10 MED ORDER — SACUBITRIL-VALSARTAN 97-103 MG PO TABS: 1.0000 | ORAL_TABLET | Freq: Two times a day (BID) | ORAL | 3 refills | Status: DC

## 2019-06-10 NOTE — Telephone Encounter (Signed)
Received faxed request from patient including letter from [employer] for [place], application and instructions and pt's financial information.  Paperwork completed, RX printed.  Awaiting Dr Marlou Porch' signatures prior to faxing to company @ 1 [phone].

## 2019-06-16 ENCOUNTER — Other Ambulatory Visit: Payer: Self-pay | Admitting: *Deleted

## 2019-06-16 MED ORDER — SACUBITRIL-VALSARTAN 97-103 MG PO TABS: 1.0000 | ORAL_TABLET | Freq: Two times a day (BID) | ORAL | 3 refills | Status: DC

## 2019-06-16 NOTE — Telephone Encounter (Signed)
Paperwork/Rx has been completed and signed by Dr Marlou Porch.  Faxed to [phone].

## 2019-06-29 NOTE — Telephone Encounter (Signed)
Follow up    Please fax forms again , to [employer] [phone]

## 2019-06-29 NOTE — Telephone Encounter (Signed)
Faxed paperwork, rx and application again as requested.

## 2019-07-15 ENCOUNTER — Ambulatory Visit (INDEPENDENT_AMBULATORY_CARE_PROVIDER_SITE_OTHER): Payer: Self-pay | Admitting: *Deleted

## 2019-07-15 DIAGNOSIS — I428 Other cardiomyopathies: Secondary | ICD-10-CM

## 2019-07-15 LAB — CUP PACEART REMOTE DEVICE CHECK
Date Time Interrogation Session: 20201230154007
Implantable Lead Implant Date: 20190121
Implantable Lead Location: 753860
Implantable Lead Model: 293
Implantable Lead Serial Number: 435292
Implantable Pulse Generator Implant Date: 20190121
Pulse Gen Serial Number: 241142

## 2019-09-30 ENCOUNTER — Telehealth: Payer: Self-pay | Admitting: Cardiology

## 2019-09-30 MED ORDER — APIXABAN 5 MG PO TABS: 5.0000 mg | ORAL_TABLET | Freq: Two times a day (BID) | ORAL | 3 refills | Status: DC

## 2019-09-30 NOTE — Telephone Encounter (Signed)
Patient calling to find out if the paperwork he faxed over yesterday was received. He says it is regarding a medication.

## 2019-09-30 NOTE — Telephone Encounter (Signed)
Called the patient and let them know that his paperwork filled out and faxed to [facility]. He verbalized understanding.

## 2019-09-30 NOTE — Telephone Encounter (Signed)
Patient returned call, advised him of message below. Patient verbalized understanding.

## 2019-09-30 NOTE — Telephone Encounter (Signed)
Paperwork found filled out and faxed to Alver Fisher attempted to let pt know Voicemail not set up unable to leave message.Will try later .Zack Seal

## 2019-10-01 ENCOUNTER — Telehealth: Payer: Self-pay

## 2019-10-01 NOTE — Telephone Encounter (Signed)
**Note De-Identified [name] Obfuscation** The pt left his completed [employer] Pt Asst application for Eliquis at the office. I have completed the provider part, scanned and emailed all to Dr [name] nurse to obtain his signature, date it and to fax all to [employer] Pt Asst Program at the number written on cover sheet included.

## 2019-10-01 NOTE — Telephone Encounter (Signed)
Hey - Thank you.  I have copies of everything that was faxed to [employer] yesterday by Wynona Canes.  It includes all the paperwork and a signed RX by Dr [name].  I am holding onto it so it doesn't get lost.

## 2019-10-04 ENCOUNTER — Telehealth: Payer: Self-pay | Admitting: Physician Assistant

## 2019-10-04 NOTE — Telephone Encounter (Signed)
I received a call from the answering service saying that Mr. Cotten had called complaining of some hematuria.  I tried both numbers listed and got no answer, was not able to leave a message.  Theodore Demark, PA-C 10/04/2019 5:13 PM

## 2019-10-14 ENCOUNTER — Ambulatory Visit (INDEPENDENT_AMBULATORY_CARE_PROVIDER_SITE_OTHER): Payer: Self-pay | Admitting: *Deleted

## 2019-10-14 DIAGNOSIS — I428 Other cardiomyopathies: Secondary | ICD-10-CM

## 2019-10-14 LAB — CUP PACEART REMOTE DEVICE CHECK
Battery Remaining Longevity: 168 mo
Battery Remaining Percentage: 100 %
Brady Statistic RV Percent Paced: 0 %
Date Time Interrogation Session: 20210331022100
HighPow Impedance: 69 Ohm
Implantable Lead Implant Date: 20190121
Implantable Lead Location: 753860
Implantable Lead Model: 293
Implantable Lead Serial Number: 435292
Implantable Pulse Generator Implant Date: 20190121
Lead Channel Impedance Value: 484 Ohm
Lead Channel Setting Pacing Amplitude: 2.5 V
Lead Channel Setting Pacing Pulse Width: 0.4 ms
Lead Channel Setting Sensing Sensitivity: 0.5 mV
Pulse Gen Serial Number: 241142

## 2019-10-14 NOTE — Progress Notes (Signed)
ICD Remote  

## 2019-10-27 ENCOUNTER — Telehealth: Payer: Self-pay | Admitting: Cardiology

## 2019-10-27 NOTE — Telephone Encounter (Signed)
New Message  Pt called and stated that he needed to talk to Dr. [name] regarding disability.   Please call back to discuss

## 2019-10-27 NOTE — Telephone Encounter (Signed)
Pt is aware that needs to drop disability forms off to be forwarded to the company that will fill out forms./cy

## 2019-11-28 IMAGING — CR DG CHEST 2V
2 series · 2 of 2 positions shown · non-contrast
Comparison: 03/15/2017

CLINICAL DATA: Post fibrillator placement.

EXAM:
CHEST  2 VIEW

[chest pa]
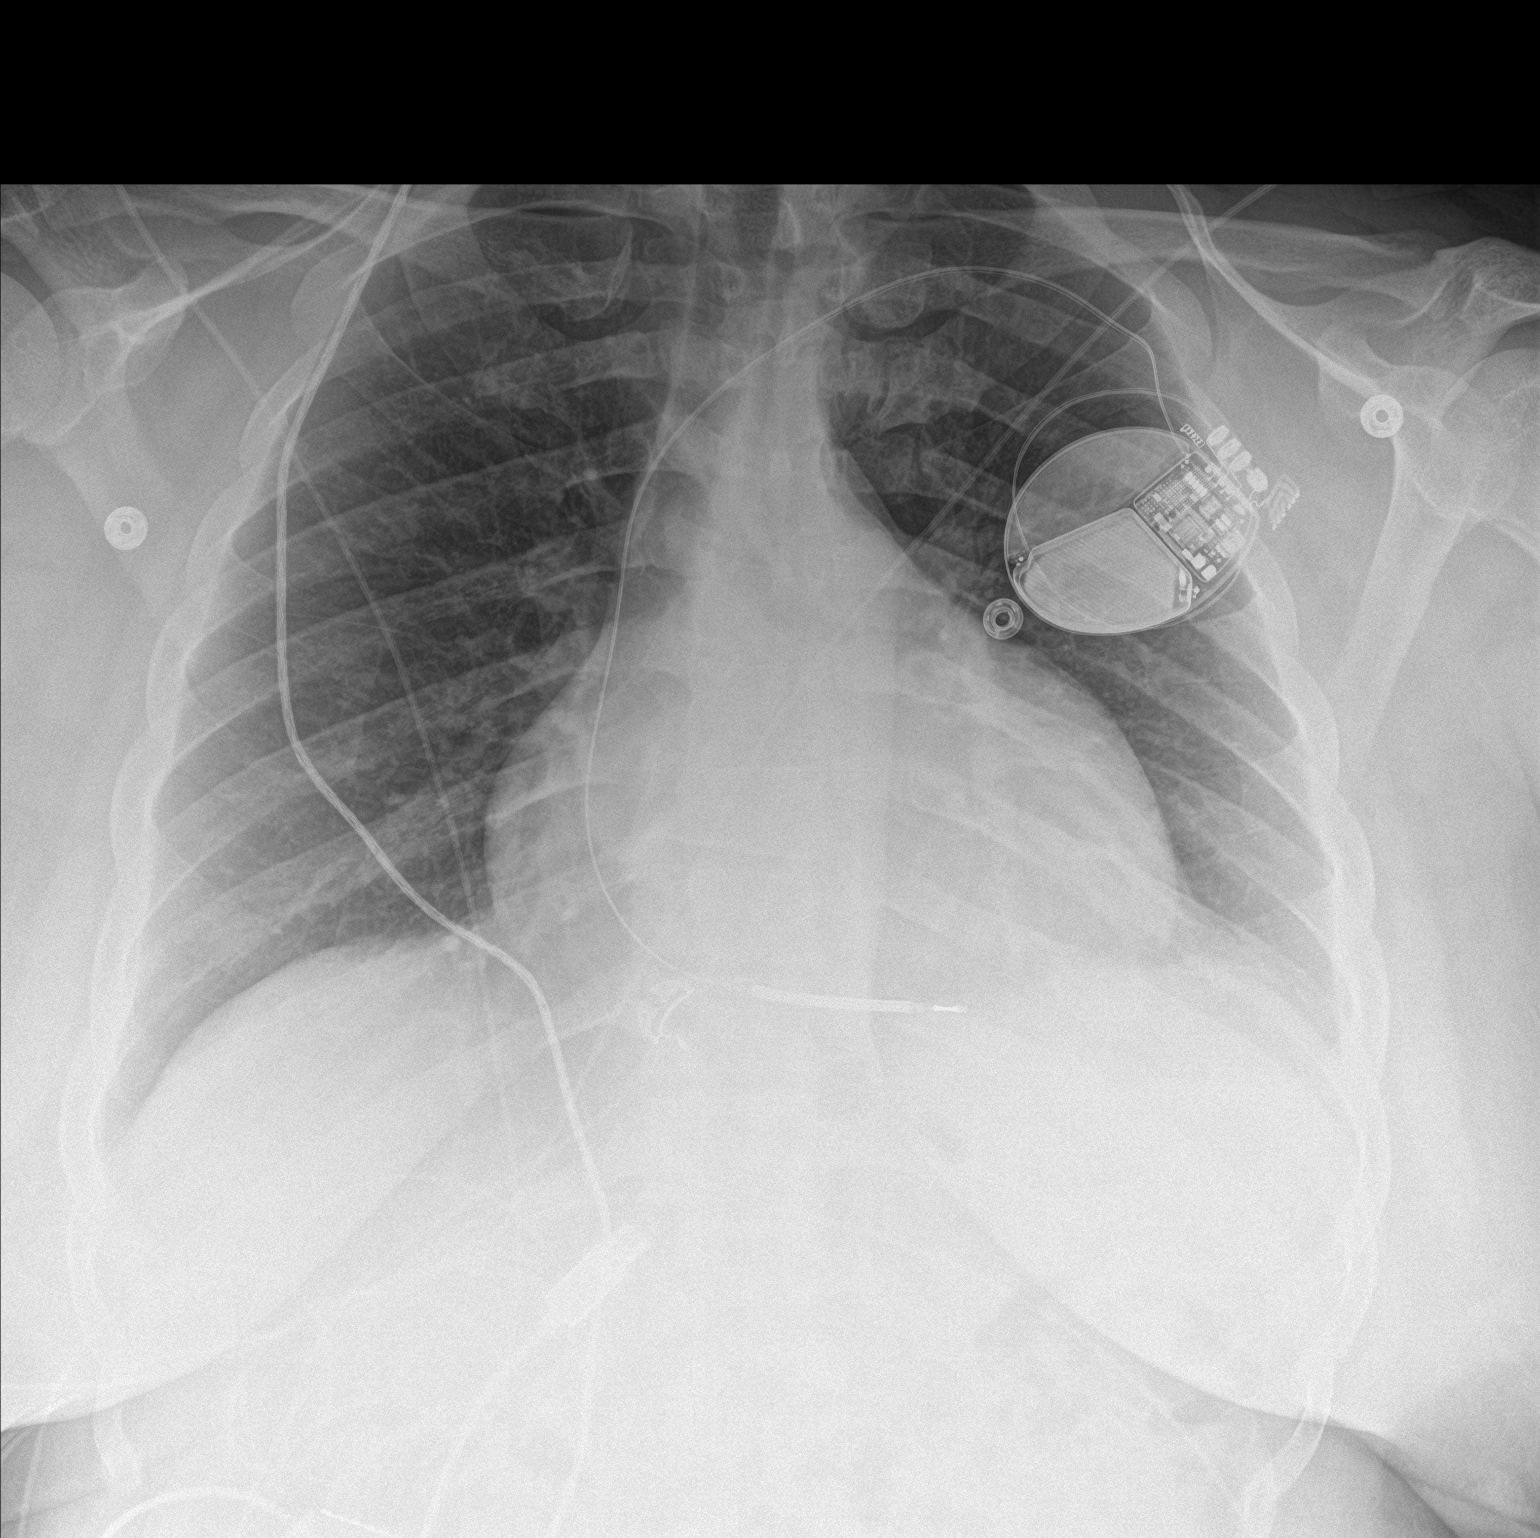

[chest lat]
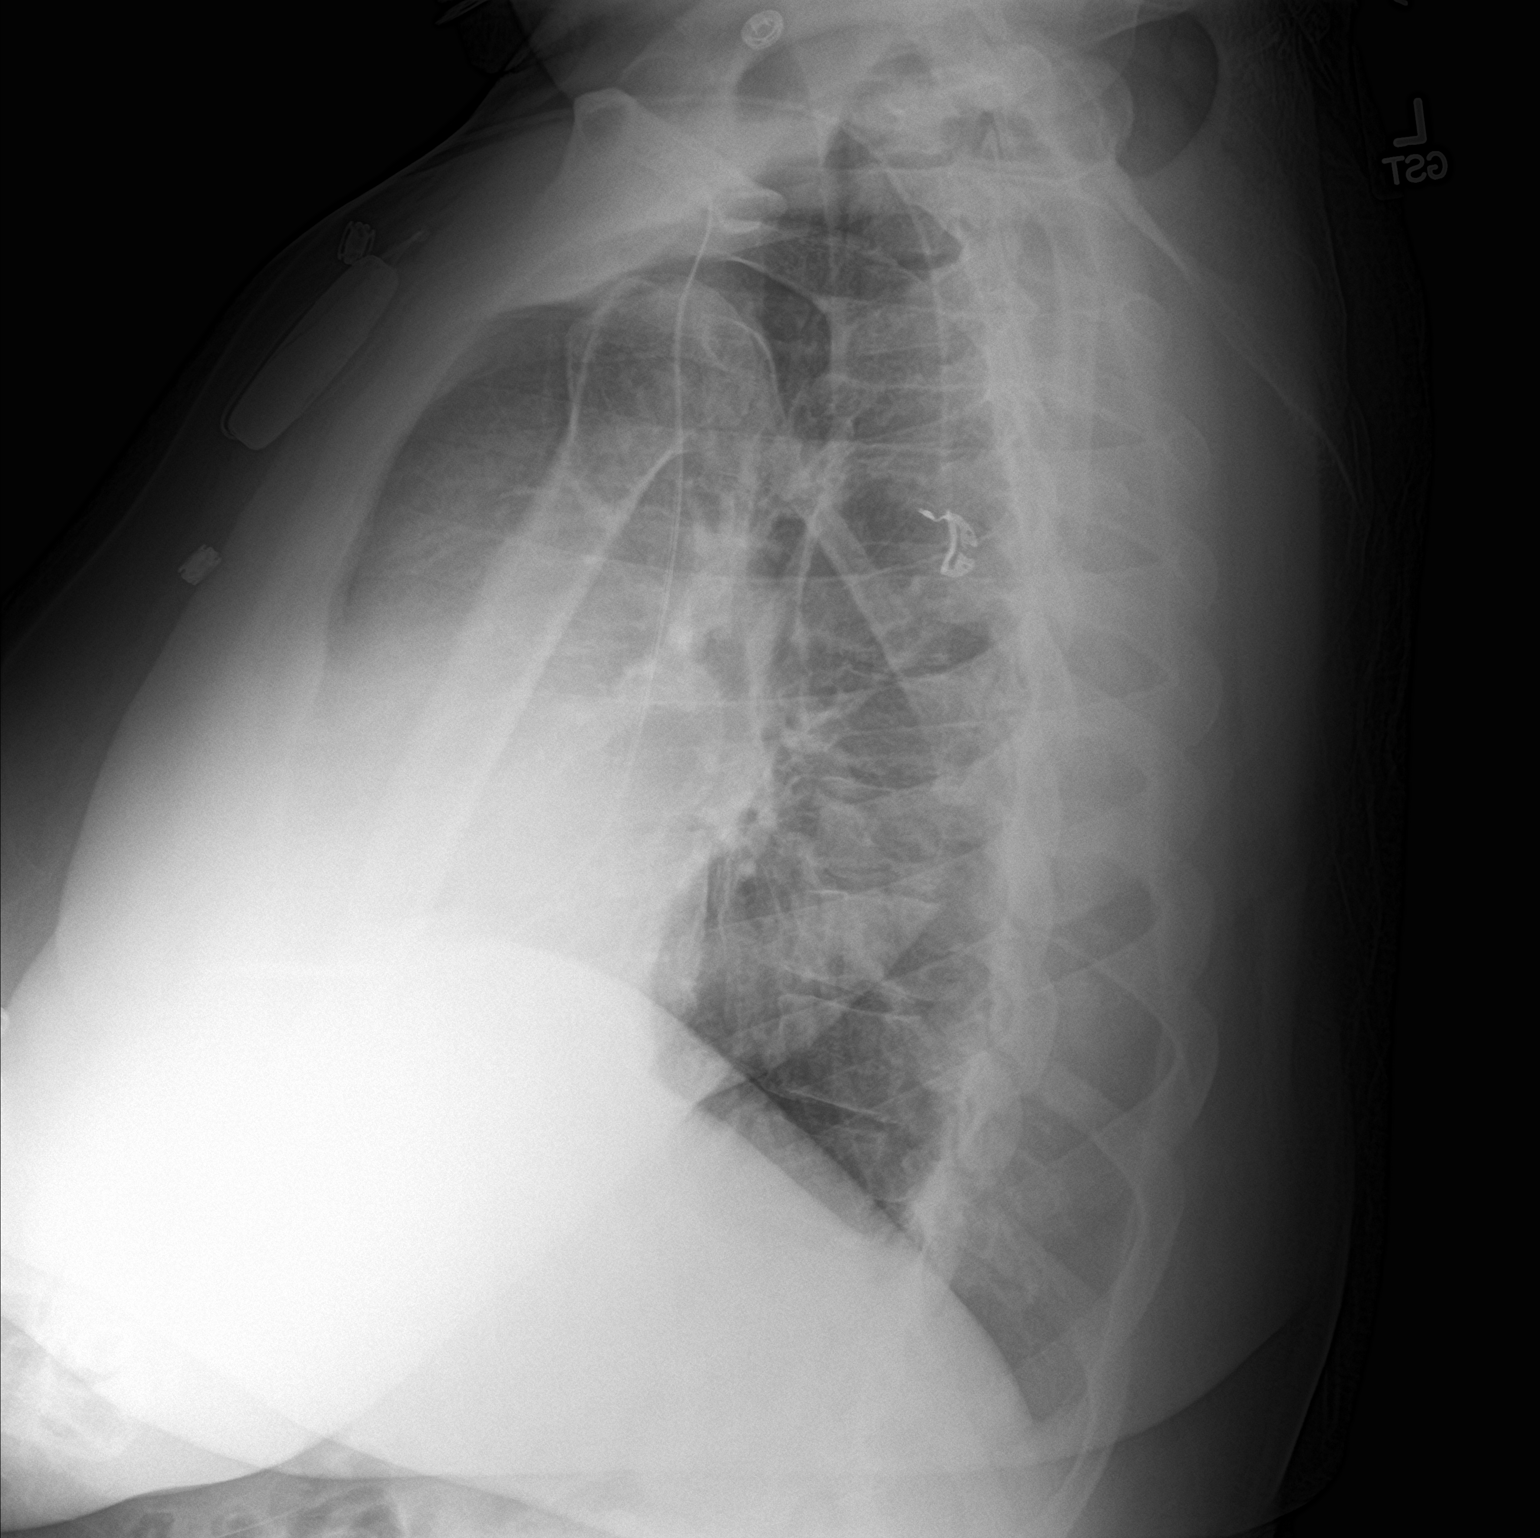

[2 of 2 positions shown; findings below may reference images not displayed]

FINDINGS: Interval placement of left-sided ICD. Lungs are adequately inflated
and otherwise clear. Borderline cardiomegaly unchanged. Remainder of
the exam is unchanged.
IMPRESSION: No acute cardiopulmonary disease.

Stable borderline cardiomegaly.

Placement of left-sided ICD.

## 2020-01-13 ENCOUNTER — Ambulatory Visit (INDEPENDENT_AMBULATORY_CARE_PROVIDER_SITE_OTHER): Payer: Self-pay | Admitting: *Deleted

## 2020-01-13 DIAGNOSIS — I428 Other cardiomyopathies: Secondary | ICD-10-CM

## 2020-01-13 LAB — CUP PACEART REMOTE DEVICE CHECK
Battery Remaining Longevity: 162 mo
Battery Remaining Percentage: 100 %
Brady Statistic RV Percent Paced: 0 %
Date Time Interrogation Session: 20210630014000
HighPow Impedance: 70 Ohm
Implantable Lead Implant Date: 20190121
Implantable Lead Location: 753860
Implantable Lead Model: 293
Implantable Lead Serial Number: 435292
Implantable Pulse Generator Implant Date: 20190121
Lead Channel Impedance Value: 479 Ohm
Lead Channel Setting Pacing Amplitude: 2.5 V
Lead Channel Setting Pacing Pulse Width: 0.4 ms
Lead Channel Setting Sensing Sensitivity: 0.5 mV
Pulse Gen Serial Number: 241142

## 2020-01-14 NOTE — Progress Notes (Signed)
Remote ICD transmission.   

## 2020-02-29 ENCOUNTER — Telehealth: Payer: Self-pay | Admitting: Cardiology

## 2020-02-29 NOTE — Telephone Encounter (Signed)
Returned the patients call and LMTCB

## 2020-02-29 NOTE — Telephone Encounter (Signed)
Jartavious is calling requesting to speak with Dr. [name] in regards to getting the Covid Vaccine. Please advise.

## 2020-03-01 NOTE — Telephone Encounter (Signed)
New Message  Please give patient a call back to discuss.

## 2020-03-01 NOTE — Telephone Encounter (Signed)
Left message for patient to call back  

## 2020-03-01 NOTE — Telephone Encounter (Signed)
Left message to call back  

## 2020-03-02 NOTE — Telephone Encounter (Signed)
Left message for patient to call back  

## 2020-03-03 NOTE — Telephone Encounter (Signed)
Multiple messages have been left for pt to call back without a return call.  Will close this encounter and await a return call from pt.

## 2020-03-28 ENCOUNTER — Telehealth: Payer: Self-pay | Admitting: Cardiology

## 2020-03-28 NOTE — Telephone Encounter (Signed)
Left message for patient to call back  

## 2020-03-28 NOTE — Telephone Encounter (Signed)
    Pt calling back about his inquiry regarding covid vaccine, advised that we do recommend it to all of our patients. However, he said he wanted to to speak a Nurse since he have heart issues.

## 2020-03-30 NOTE — Telephone Encounter (Signed)
Left message for patient to call back  

## 2020-04-01 NOTE — Telephone Encounter (Signed)
We have attempted several times to reach pt and left message for him to call back with his concerns.  He has not done so.  Will close this encounter and await a return call from pt.

## 2020-04-13 ENCOUNTER — Ambulatory Visit (INDEPENDENT_AMBULATORY_CARE_PROVIDER_SITE_OTHER): Payer: Self-pay | Admitting: Emergency Medicine

## 2020-04-13 DIAGNOSIS — I428 Other cardiomyopathies: Secondary | ICD-10-CM

## 2020-04-14 LAB — CUP PACEART REMOTE DEVICE CHECK
Battery Remaining Longevity: 162 mo
Battery Remaining Percentage: 100 %
Brady Statistic RV Percent Paced: 0 %
Date Time Interrogation Session: 20210929014100
HighPow Impedance: 67 Ohm
Implantable Lead Implant Date: 20190121
Implantable Lead Location: 753860
Implantable Lead Model: 293
Implantable Lead Serial Number: 435292
Implantable Pulse Generator Implant Date: 20190121
Lead Channel Impedance Value: 496 Ohm
Lead Channel Setting Pacing Amplitude: 2.5 V
Lead Channel Setting Pacing Pulse Width: 0.4 ms
Lead Channel Setting Sensing Sensitivity: 0.5 mV
Pulse Gen Serial Number: 241142

## 2020-04-15 NOTE — Progress Notes (Signed)
Remote ICD transmission.   

## 2020-04-27 ENCOUNTER — Other Ambulatory Visit: Payer: Self-pay

## 2020-04-27 MED ORDER — SACUBITRIL-VALSARTAN 97-103 MG PO TABS: 1.0000 | ORAL_TABLET | Freq: Two times a day (BID) | ORAL | 0 refills | Status: DC

## 2020-04-27 NOTE — Addendum Note (Signed)
Addended by: Margaret Pyle D on: 04/27/2020 02:21 PM   Modules accepted: Orders

## 2020-06-06 ENCOUNTER — Other Ambulatory Visit: Payer: Self-pay | Admitting: Cardiology

## 2020-06-14 ENCOUNTER — Telehealth: Payer: Self-pay | Admitting: Cardiology

## 2020-06-14 NOTE — Telephone Encounter (Signed)
*  STAT* If patient is at the pharmacy, call can be transferred to refill team.   1. Which medications need to be refilled? (please list name of each medication and dose if known) apixaban (ELIQUIS) 5 MG TABS tablet  2. Which pharmacy/location (including street and city if local pharmacy) is medication to be sent to? Patient states he does not know the name of the pharmacy, but he provided a fax number [phone]).  3. Do they need a 30 day or 90 day supply? 90 day supply

## 2020-06-14 NOTE — Telephone Encounter (Addendum)
Eliquis 5mg  refill request received. Patient is 34 years old, weight-152.7kg, Crea-1.30 on 03/31/2019, Diagnosis-Afib, and last seen by Dr. 04/02/2019 on 03/31/2019 and has a pending appt for 07/19/2020. Dose is appropriate based on dosing criteria but pt needs updated labs.   Called pt to see if he could get labs done before next appt and to clarify pharmacy as we need a Pharmacy name and not just a fax number but the mailbox is full.

## 2020-06-20 NOTE — Telephone Encounter (Signed)
Pt called in and stated that this med does not go though a pharmacy.  He gets it straight from the company.    Fax [phone]  Pt stated he is coming to see [name] on 1/4

## 2020-06-30 ENCOUNTER — Telehealth: Payer: Self-pay | Admitting: Cardiology

## 2020-06-30 MED ORDER — APIXABAN 5 MG PO TABS
5.0000 mg | ORAL_TABLET | Freq: Two times a day (BID) | ORAL | 3 refills | Status: DC
Start: 2020-06-30 — End: 2020-08-29

## 2020-06-30 NOTE — Telephone Encounter (Signed)
Called and advised the patient that I have faxed the prescription to [employer] pt assistance. He states he has the application for patient assistance and will bring it with him on Jan 4th when he sees Dr. [name].

## 2020-06-30 NOTE — Telephone Encounter (Signed)
*  STAT* If patient is at the pharmacy, call can be transferred to refill team.   1. Which medications need to be refilled? (please list name of each medication and dose if known) Elquis   2. Which pharmacy/location (including street and city if local pharmacy) is medication to be sent to? Please call this number for refill   [phone], fax  [phone]  3. Do they need a 30 day or 90 day supply? 90

## 2020-07-01 ENCOUNTER — Other Ambulatory Visit: Payer: Self-pay

## 2020-07-01 ENCOUNTER — Telehealth: Payer: Self-pay | Admitting: Cardiology

## 2020-07-01 MED ORDER — SACUBITRIL-VALSARTAN 97-103 MG PO TABS
1.0000 | ORAL_TABLET | Freq: Two times a day (BID) | ORAL | 0 refills | Status: DC
Start: 2020-07-01 — End: 2020-07-28

## 2020-07-01 NOTE — Telephone Encounter (Signed)
Refill has been called in

## 2020-07-01 NOTE — Telephone Encounter (Signed)
    Pt called back, he said he made a mistake what he needs is entresto not eliquis

## 2020-07-01 NOTE — Telephone Encounter (Signed)
Entresto Rx has been sent to pharmacy for #60. Pt has f/u appt with Dr [name] in January.

## 2020-07-01 NOTE — Telephone Encounter (Signed)
*  STAT* If patient is at the pharmacy, call can be transferred to refill team.   1. Which medications need to be refilled? (please list name of each medication and dose if known)  sacubitril-valsartan (ENTRESTO) 97-103 MG  2. Which pharmacy/location (including street and city if local pharmacy) is medication to be sent to? Please call this number for refill   [phone], fax  [phone]  3. Do they need a 30 day or 90 day supply? 90

## 2020-07-13 ENCOUNTER — Ambulatory Visit (INDEPENDENT_AMBULATORY_CARE_PROVIDER_SITE_OTHER): Payer: Self-pay

## 2020-07-13 DIAGNOSIS — I480 Paroxysmal atrial fibrillation: Secondary | ICD-10-CM

## 2020-07-13 DIAGNOSIS — I428 Other cardiomyopathies: Secondary | ICD-10-CM

## 2020-07-13 LAB — CUP PACEART REMOTE DEVICE CHECK
Battery Remaining Longevity: 162 mo
Battery Remaining Percentage: 100 %
Brady Statistic RV Percent Paced: 0 %
Date Time Interrogation Session: 20211229015300
HighPow Impedance: 68 Ohm
Implantable Lead Implant Date: 20190121
Implantable Lead Location: 753860
Implantable Lead Model: 293
Implantable Lead Serial Number: 435292
Implantable Pulse Generator Implant Date: 20190121
Lead Channel Impedance Value: 495 Ohm
Lead Channel Setting Pacing Amplitude: 2.5 V
Lead Channel Setting Pacing Pulse Width: 0.4 ms
Lead Channel Setting Sensing Sensitivity: 0.5 mV
Pulse Gen Serial Number: 241142

## 2020-07-19 ENCOUNTER — Ambulatory Visit: Payer: Self-pay | Admitting: Cardiology

## 2020-07-27 NOTE — Progress Notes (Signed)
Remote ICD transmission.   

## 2020-07-28 ENCOUNTER — Other Ambulatory Visit: Payer: Self-pay

## 2020-07-28 MED ORDER — SACUBITRIL-VALSARTAN 97-103 MG PO TABS
1.0000 | ORAL_TABLET | Freq: Two times a day (BID) | ORAL | 0 refills | Status: DC
Start: 2020-07-28 — End: 2020-12-05

## 2020-08-11 ENCOUNTER — Telehealth (INDEPENDENT_AMBULATORY_CARE_PROVIDER_SITE_OTHER): Payer: BC Managed Care – PPO | Admitting: Cardiology

## 2020-08-11 ENCOUNTER — Telehealth: Payer: Self-pay | Admitting: *Deleted

## 2020-08-11 ENCOUNTER — Encounter: Payer: Self-pay | Admitting: Cardiology

## 2020-08-11 ENCOUNTER — Other Ambulatory Visit: Payer: Self-pay

## 2020-08-11 VITALS — BP 162/96 | HR 58 | Ht 70.0 in | Wt 321.0 lb

## 2020-08-11 DIAGNOSIS — I480 Paroxysmal atrial fibrillation: Secondary | ICD-10-CM | POA: Diagnosis not present

## 2020-08-11 DIAGNOSIS — I428 Other cardiomyopathies: Secondary | ICD-10-CM

## 2020-08-11 NOTE — Patient Instructions (Signed)
Medication Instructions:  The current medical regimen is effective;  continue present plan and medications.  *If you need a refill on your cardiac medications before your next appointment, please call your pharmacy*  Lab Work: Please have blood work completed at the closest lab to you. You will need a CBC and BMP. I have found a lab named [facility].  It is located at [address] [place], [place] [address].  Their phone number is [phone].  Please contact them and see if they can draw your blood.  If so, I can mail you the orders to have it completed.  If they can not draw it, please do an on-line for labs close to you.  Follow-Up: At [facility], you and your health needs are our priority.  As part of our continuing mission to provide you with exceptional heart care, we have created designated Provider Care Teams.  These Care Teams include your primary Cardiologist (physician) and Advanced Practice Providers (APPs -  Physician Assistants and Nurse Practitioners) who all work together to provide you with the care you need, when you need it.  We recommend signing up for the patient portal called "MyChart".  Sign up information is provided on this After Visit Summary.  MyChart is used to connect with patients for Virtual Visits (Telemedicine).  Patients are able to view lab/test results, encounter notes, upcoming appointments, etc.  Non-urgent messages can be sent to your provider as well.   To learn more about what you can do with MyChart, go to [email or web address].    Your next appointment:   6 month(s)  The format for your next appointment:   In Person  Provider:   Donato Schultz, MD

## 2020-08-11 NOTE — Progress Notes (Signed)
Virtual Visit via Video Note   This visit type was conducted due to national recommendations for restrictions regarding the COVID-19 Pandemic (e.g. social distancing) in an effort to limit this patient's exposure and mitigate transmission in our community.  Due to his co-morbid illnesses, this patient is at least at moderate risk for complications without adequate follow up.  This format is felt to be most appropriate for this patient at this time.  All issues noted in this document were discussed and addressed.  A limited physical exam was performed with this format.  Please refer to the patient's chart for his consent to telehealth for [facility].       Date:  08/11/2020   ID:  Richard Cochran, DOB 1985/07/20, MRN [id number] The patient was identified using 2 identifiers.  Patient Location: Home Provider Location: Home Office  PCP:  Pcp, No  Cardiologist:  Donato Schultz, MD  Electrophysiologist:  None   Evaluation Performed:  Follow-Up Visit  Chief Complaint: Dilated cardiomyopathy  History of Present Illness:    Richard Cochran is a 35 y.o. 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 ECHO 03/14/17 and 07/15/17 showing EF once again of 10-15%. Likely HTN cardiomyopathy.  No shock from LifeVest.  No CAD on cardiac catheterization  Blood pressure much better controlled, 107/85 at home.   Sleep study on hold because of no insurance.   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  02/14/2018- no orthopnea no PND.  He does have apnea-like symptoms at night his significant other states.  Cannot afford sleep study because he does not have insurance.  It would cost him $2000 out of pocket.  Overall no chest pain.  He has excellent battery life on his ICD.  Dr. [name] is monitoring.  He had atrial fibrillation in the hospital setting.  He is on Eliquis.  No bleeding.  03/31/2019-here for the follow-up of cardiomyopathy-last EF was 15%.   Defibrillator in place.  On goal-directed therapy with medications as below.  He is tolerating these well.  Occasionally when bending over and getting up or getting up quickly he may feel some transient dizziness.  No syncope.  Tolerating the meds well.  No bleeding fevers chills nausea vomiting syncope.  He is now living in [place] helping to take care of his child.  08/11/2020-here for follow-up of cardiomyopathy.  Doing very well.  NYHA class I-II symptoms.  No defibrillations.  Feels great.  He is down in [place] currently.  Taking his medications he states.  Asking for some blood work.  The patient does not have symptoms concerning for COVID-19 infection (fever, chills, cough, or new shortness of breath).    Past Medical History:  Diagnosis Date  . [employer] ICD (implantable cardioverter-defibrillator) in place   . Childhood asthma   . CKD (chronic kidney disease)    "left kidney weaker than right" (03/13/2017)  . Hypertension   . NSTEMI (non-ST elevated myocardial infarction) (HCC) 03/13/2017   [name] 03/13/2017  . PAF (paroxysmal atrial fibrillation) (HCC)   . Renal insufficiency    Past Surgical History:  Procedure Laterality Date  . ICD IMPLANT N/A 08/05/2017   Procedure: ICD IMPLANT;  Surgeon: Marinus Maw, MD;  [facility]: [facility] INVASIVE CV LAB;  Service: Cardiovascular;  Laterality: N/A;  . LEFT HEART CATH AND CORONARY ANGIOGRAPHY N/A 03/14/2017   Procedure: LEFT HEART CATH AND CORONARY ANGIOGRAPHY;  Surgeon: Swaziland, Peter M, MD;  Location:  [facility] INVASIVE CV LAB;  Service: Cardiovascular;  Laterality: N/A;  . NO PAST SURGERIES       Current Meds  Medication Sig  . acetaminophen (TYLENOL) 325 MG tablet Take 2 tablets (650 mg total) by mouth every 4 (four) hours as needed for headache or mild pain.  Marland Kitchen apixaban (ELIQUIS) 5 MG TABS tablet Take 1 tablet (5 mg total) by mouth 2 (two) times daily.  Marland Kitchen atorvastatin (LIPITOR) 20 MG tablet TAKE 1 TABLET BY MOUTH ONCE  DAILY AT  6  PM  . carvedilol (COREG) 25 MG tablet TAKE 1 & 1/2 (ONE & ONE-HALF) TABLETS BY MOUTH TWICE DAILY WITH MEALS  . isosorbide mononitrate (IMDUR) 30 MG 24 hr tablet Take 1 tablet by mouth once daily  . sacubitril-valsartan (ENTRESTO) 97-103 MG Take 1 tablet by mouth 2 (two) times daily. Please keep upcoming appt in January 2022 with Dr. [name] before anymore refills. Thank you  . spironolactone (ALDACTONE) 25 MG tablet Take 1/2 (one-half) tablet by mouth once daily     Allergies:   Patient has no known allergies.   Social History   Tobacco Use  . Smoking status: Former Smoker    Years: 2.00    Types: Cigarettes  . Smokeless tobacco: Never Used  . Tobacco comment: 03/13/2017 "quit when I was 11 or 12"  Vaping Use  . Vaping Use: Never used  Substance Use Topics  . Alcohol use: Yes    Comment: 03/13/2017 "I drink on my birthday; nothing else"  . Drug use: No     Family Hx: The patient's family history includes Hypertension in his father and mother.  ROS:   Please see the history of present illness.     All other systems reviewed and are negative.    Recent Labs: No results found for requested labs within last 8760 hours.   Recent Lipid Panel Lab Results  Component Value Date/Time   CHOL 130 03/31/2019 10:23 AM   TRIG 90 03/31/2019 10:23 AM   HDL 46 03/31/2019 10:23 AM   CHOLHDL 2.8 03/31/2019 10:23 AM   CHOLHDL 3.3 03/14/2017 02:14 AM   LDLCALC 67 03/31/2019 10:23 AM    Wt Readings from Last 3 Encounters:  08/11/20 (!) 321 lb (145.6 kg)  03/31/19 (!) 336 lb 9.6 oz (152.7 kg)  08/12/18 (!) 349 lb 9.6 oz (158.6 kg)     Risk Assessment/Calculations:      Objective:    Vital Signs:  Ht 5\' 10"  (1.778 m)   Wt (!) 321 lb (145.6 kg)   BMI 46.06 kg/m    VITAL SIGNS:  reviewed  Able to complete full sentences without difficulty.  Alert and oriented x3  ASSESSMENT & PLAN:     Dilated cardiomyopathy/nonischemic cardiomyopathy -Likely hypertensive in  origin given his many years of elevated blood pressure.  Despite medications over the last few months and normal blood pressure control, his ejection fraction remains low at 10-15%.  Has defibrillator.  Narrow complex QRS.  Currently NYHA class II -Continue medications as above.  Appreciate pharmacy assistance with titration of medications.  Excellent.    Overall been doing quite well.  Feels well.  We will go ahead and order some lab work for him to take in his current location.  Basic metabolic profile and CBC.   Paroxysmal atrial fibrillation - In the hospital was noted to have atrial fibrillation generally in the 80s in the 90s.  Eliquis.  Encouraged sleep study however he cannot afford it without  medical insurance.  Perhaps now that he has insurance through his job, he could consider sleep study. EKG in January 2020 showed normal rhythm.  Morbid obesity -Continue to encourage weight loss, he is down about 30 pounds.  Excellent job.  Continue.         COVID-19 Education: The signs and symptoms of COVID-19 were discussed with the patient and how to seek care for testing (follow up with PCP or arrange E-visit).  The importance of social distancing was discussed today.  Time:   Today, I have spent 21 minutes with the patient with telehealth technology discussing the above problems.     Medication Adjustments/Labs and Tests Ordered: Current medicines are reviewed at length with the patient today.  Concerns regarding medicines are outlined above.   Tests Ordered: No orders of the defined types were placed in this encounter.   Medication Changes: No orders of the defined types were placed in this encounter.   Follow Up:  In Person in 6 month(s)  Signed, Donato Schultz, MD  08/11/2020 3:39 PM    [facility]

## 2020-08-11 NOTE — Telephone Encounter (Signed)
  Patient Consent for Virtual Visit         Richard Cochran has provided verbal consent on 08/11/2020 for a virtual visit (video or telephone).   CONSENT FOR VIRTUAL VISIT FOR:  Richard Cochran  By participating in this virtual visit I agree to the following:  I hereby voluntarily request, consent and authorize [facility] and its employed or contracted physicians, physician assistants, nurse practitioners or other licensed health care professionals (the Practitioner), to provide me with telemedicine health care services (the "Services") as deemed necessary by the treating Practitioner. I acknowledge and consent to receive the Services by the Practitioner via telemedicine. I understand that the telemedicine visit will involve communicating with the Practitioner through live audiovisual communication technology and the disclosure of certain medical information by electronic transmission. I acknowledge that I have been given the opportunity to request an in-person assessment or other available alternative prior to the telemedicine visit and am voluntarily participating in the telemedicine visit.  I understand that I have the right to withhold or withdraw my consent to the use of telemedicine in the course of my care at any time, without affecting my right to future care or treatment, and that the Practitioner or I may terminate the telemedicine visit at any time. I understand that I have the right to inspect all information obtained and/or recorded in the course of the telemedicine visit and may receive copies of available information for a reasonable fee.  I understand that some of the potential risks of receiving the Services via telemedicine include:  Marland Kitchen Delay or interruption in medical evaluation due to technological equipment failure or disruption; . Information transmitted may not be sufficient (e.g. poor resolution of images) to allow for appropriate medical decision making by the Practitioner;  and/or  . In rare instances, security protocols could fail, causing a breach of personal health information.  Furthermore, I acknowledge that it is my responsibility to provide information about my medical history, conditions and care that is complete and accurate to the best of my ability. I acknowledge that Practitioner's advice, recommendations, and/or decision may be based on factors not within their control, such as incomplete or inaccurate data provided by me or distortions of diagnostic images or specimens that may result from electronic transmissions. I understand that the practice of medicine is not an exact science and that Practitioner makes no warranties or guarantees regarding treatment outcomes. I acknowledge that a copy of this consent can be made available to me via my patient portal [facility] MyChart), or I can request a printed copy by calling the office of [facility].    I understand that my insurance will be billed for this visit.   I have read or had this consent read to me. . I understand the contents of this consent, which adequately explains the benefits and risks of the Services being provided via telemedicine.  . I have been provided ample opportunity to ask questions regarding this consent and the Services and have had my questions answered to my satisfaction. . I give my informed consent for the services to be provided through the use of telemedicine in my medical care

## 2020-08-29 ENCOUNTER — Other Ambulatory Visit: Payer: Self-pay | Admitting: Pharmacist

## 2020-08-29 MED ORDER — APIXABAN 5 MG PO TABS
5.0000 mg | ORAL_TABLET | Freq: Two times a day (BID) | ORAL | 1 refills | Status: DC
Start: 2020-08-29 — End: 2021-03-08

## 2020-08-29 NOTE — Progress Notes (Signed)
Received el[facility] which [facility]'t [facility] pt's profile. Called pt, he now has [facility], [facility] in [place] where he's currently living. Last visit Jan 2022, SCr 1.3 on 03/31/19, age 35, weight 145kg

## 2020-09-13 NOTE — Telephone Encounter (Signed)
Good Morning,  Just touching base with you to make you aware we have received the results of your recent blood work.  Everything looks great.  Please continue your current medications and therapy.  No changes are needed at this time.  Elita Quick, RN for Dr Donato Schultz

## 2020-09-19 ENCOUNTER — Telehealth: Payer: Self-pay | Admitting: *Deleted

## 2020-09-19 MED ORDER — ISOSORBIDE MONONITRATE ER 30 MG PO TB24
30.0000 mg | ORAL_TABLET | Freq: Every day | ORAL | 1 refills | Status: DC
Start: 2020-09-19 — End: 2021-03-08

## 2020-09-19 MED ORDER — CARVEDILOL 25 MG PO TABS
ORAL_TABLET | ORAL | 1 refills | Status: DC
Start: 2020-09-19 — End: 2021-03-08

## 2020-09-19 MED ORDER — SPIRONOLACTONE 25 MG PO TABS
ORAL_TABLET | ORAL | 1 refills | Status: DC
Start: 2020-09-19 — End: 2021-03-08

## 2020-09-19 MED ORDER — ATORVASTATIN CALCIUM 20 MG PO TABS: ORAL_TABLET | ORAL | 1 refills | Status: DC

## 2020-09-19 NOTE — Telephone Encounter (Signed)
Pt called in for refills of Atorvastatin, Carvedilol, Imdur and Spironolactone.

## 2020-10-11 ENCOUNTER — Telehealth: Payer: Self-pay

## 2020-10-11 NOTE — Telephone Encounter (Signed)
Paperwork completed, signed by Dr [name] and faxed as requested.

## 2020-10-11 NOTE — Telephone Encounter (Signed)
**Note De-Identified Lashuna Tamashiro Obfuscation** The pts [employer] application for Eliquis and a [employer] Pt Asst application for Sherryll Burger was left at the office. I have completed the provider pages on both applications and have emailed all to Dr [name] nurse so she can obtain his signature, date them, and to fax to [facility] and [employer] at the fax numbers written on cover letter for each or to place in nurses box in Medical Records to be faxed.

## 2020-10-11 NOTE — Telephone Encounter (Signed)
Received, printed and given to Dr [name] to be signed.

## 2020-10-12 ENCOUNTER — Ambulatory Visit (INDEPENDENT_AMBULATORY_CARE_PROVIDER_SITE_OTHER): Payer: BC Managed Care – PPO

## 2020-10-12 DIAGNOSIS — I428 Other cardiomyopathies: Secondary | ICD-10-CM

## 2020-10-13 LAB — CUP PACEART REMOTE DEVICE CHECK
Battery Remaining Longevity: 162 mo
Battery Remaining Percentage: 100 %
Brady Statistic RV Percent Paced: 0 %
Date Time Interrogation Session: 20220330014100
HighPow Impedance: 70 Ohm
Implantable Lead Implant Date: 20190121
Implantable Lead Location: 753860
Implantable Lead Model: 293
Implantable Lead Serial Number: 435292
Implantable Pulse Generator Implant Date: 20190121
Lead Channel Impedance Value: 491 Ohm
Lead Channel Setting Pacing Amplitude: 2.5 V
Lead Channel Setting Pacing Pulse Width: 0.4 ms
Lead Channel Setting Sensing Sensitivity: 0.5 mV
Pulse Gen Serial Number: 241142

## 2020-10-14 NOTE — Telephone Encounter (Signed)
Received notification back from [employer] stating they are unable to complete the application because the pt's insurance information and income information are missing.  Both the pt's 2021 W2 and his insurance card were faxed 10/11/2020 with the completed application.  We re-fax to them requesting they review all pages please.

## 2020-10-20 NOTE — Telephone Encounter (Signed)
Received fax from [employer], [place] stating pt has prescription drug coverage.  However, pt's W2 demonstrates his income was 23,042 for 2021.  Letter scanned and emailed to [employer], LPN for her knowledge.

## 2020-10-20 NOTE — Telephone Encounter (Signed)
Received email from [employer], LPN - printed and re-faxed insurance card to [facility] (for the 3rd time) as instructed.

## 2020-10-20 NOTE — Telephone Encounter (Signed)
**Note De-Identified Fordyce Lepak Obfuscation** Letter received from [facility] stating that they need a copy of the front and back of the pts ins card. I have printed this information from the pts chart and emailed it to Dr Judd Gaudier nurse so she can fax to [facility] at fax number written on cover letter included or to place in the nurses box in Medical Records to be faxed.

## 2020-10-25 NOTE — Progress Notes (Signed)
Remote ICD transmission.   

## 2020-10-27 ENCOUNTER — Telehealth: Payer: Self-pay | Admitting: *Deleted

## 2020-10-27 NOTE — Telephone Encounter (Signed)
**Note De-Identified Morgin Halls Obfuscation** Per denial letter from [employer] pt asst the pt was denied asst for [facility] because he currently has ins coverage. Pt ID: [id number]

## 2020-10-27 NOTE — Telephone Encounter (Signed)
Pt is in need of assistance for Entresto.  [employer] denied assistance because he now has Training and development officer. Called to speak with him regarding $10 co-pay card and to send in new RX with RXBIN# [id number]  and RXPCN OHCP so that he would be able to obtain it..   NA and voicemail is full.  Unable to leave message to call back and discuss.  Will continue to attempt to contact pt regarding this matter.   Will send to [employer] to see if she can assist with this.

## 2020-11-15 NOTE — Telephone Encounter (Signed)
Attempted again to contact patient regarding co-pay card for [facility].  Call was sent to VM that is full.  Unable to leave a message.

## 2020-11-24 ENCOUNTER — Encounter: Payer: Self-pay | Admitting: *Deleted

## 2020-11-24 NOTE — Telephone Encounter (Signed)
Mailed copay cards for Sherryll Burger and Eliquis to pt's home address.  Requested he c/b if any questions or concerns.

## 2020-12-05 ENCOUNTER — Telehealth: Payer: Self-pay | Admitting: *Deleted

## 2020-12-05 ENCOUNTER — Telehealth: Payer: Self-pay

## 2020-12-05 MED ORDER — SACUBITRIL-VALSARTAN 97-103 MG PO TABS: 1.0000 | ORAL_TABLET | Freq: Two times a day (BID) | ORAL | 11 refills | Status: DC

## 2020-12-05 NOTE — Telephone Encounter (Signed)
**Note De-Identified Elijah Phommachanh Obfuscation** Eliquis PA started through covermymeds. Key: [id number]

## 2020-12-05 NOTE — Telephone Encounter (Signed)
PT NEEDS TO SPEAK W/ THE NURSE, PT SAYS THAT HE RECEIVED A PAPER IN THE MAIL ABOUT A $10 COPAY FOR THE GENERIC OF ENTRESTO AND IS WANTING TO KNOW IF IT IS OK TO TAKE.  The above information was copied and pasted into an old telephone note that had been closed.  Called and spoke with patient.  He received the co-pay cards I mailed to him.  The Eliquis, he reports, he is still getting through the company.  The [facility], he can use the co-pay card but needs a new script sent into [place], [place].  Advised I will sent the script in as requested.

## 2020-12-05 NOTE — Telephone Encounter (Signed)
PT NEEDS TO SPEAK W/ THE NURSE, PT SAYS THAT HE RECEIVED A PAPER IN THE MAIL ABOUT A $10 COPAY FOR THE GENERIC OF ENTRESTO AND IS WANTING TO KNOW IF IT IS OK TO TAKE.

## 2020-12-06 NOTE — Telephone Encounter (Signed)
**Note De-Identified Richard Cochran Obfuscation** Letter received from [facility] stating that they do not process prior authorizations for the pts plan. The letter recommended that I call the number listed on the back of the pts card.   I did and was advised by Lelon Mast that I would need to call [employer] at [phone] to s/w them concerning the pts Eliquis PA.  I called [employer] and s/w Tameka who advised me that the address we have on file for the pt does not match the address they have on file. Tameka advised that the pt needs to contact them to update his address or he needs to provide [name] with his address associated with his card.  I called both numbers in the pts chart but one is disconnected and no answer at the other and could not leave message as VM is full.  I did send the pt a [facility] message asking him to contact Larita Fife at [facility] at Dr [name] office at [phone] or to respond to my [facility] message ASAP.

## 2020-12-13 ENCOUNTER — Telehealth: Payer: Self-pay | Admitting: Cardiology

## 2020-12-13 NOTE — Telephone Encounter (Signed)
**Note De-Identified Jimmie Dattilio Obfuscation** Per the pts [facility] message the address associated with his ins card is: [address] [place] [address] which is the address I used and that we have on file. This is incorrect per [employer].

## 2020-12-13 NOTE — Telephone Encounter (Signed)
Called and spoke to patient to inform him of the $29 fee and authorization we will need to process the form.  AO 12/13/20

## 2020-12-19 DIAGNOSIS — Z0279 Encounter for issue of other medical certificate: Secondary | ICD-10-CM

## 2020-12-21 NOTE — Telephone Encounter (Signed)
Patient went to [place] location to sign authorization and give payment. Nicki Cocklin came from the [place] practice location to hand deliver the patient's authorization, payment, and billing sheet. Check-in received the payment. The forms have been placed in Dr. Judd Gaudier box to be completed. AO 12/21/20

## 2020-12-22 NOTE — Telephone Encounter (Signed)
HIM received Form back from Doctor. Form has been faxed to the number as requested on the authorization. AO 12/22/20

## 2020-12-22 NOTE — Telephone Encounter (Signed)
FMLA paperwork received in Dr [name] inbox.  PW has been completed and signed by Dr [name].  Returned to HIM for completion.

## 2021-01-11 ENCOUNTER — Ambulatory Visit (INDEPENDENT_AMBULATORY_CARE_PROVIDER_SITE_OTHER): Payer: BC Managed Care – PPO

## 2021-01-11 DIAGNOSIS — I428 Other cardiomyopathies: Secondary | ICD-10-CM | POA: Diagnosis not present

## 2021-01-11 LAB — CUP PACEART REMOTE DEVICE CHECK
Battery Remaining Longevity: 150 mo
Battery Remaining Percentage: 100 %
Brady Statistic RV Percent Paced: 0 %
Date Time Interrogation Session: 20220629014100
HighPow Impedance: 69 Ohm
Implantable Lead Implant Date: 20190121
Implantable Lead Location: 753860
Implantable Lead Model: 293
Implantable Lead Serial Number: 435292
Implantable Pulse Generator Implant Date: 20190121
Lead Channel Impedance Value: 479 Ohm
Lead Channel Setting Pacing Amplitude: 2.5 V
Lead Channel Setting Pacing Pulse Width: 0.4 ms
Lead Channel Setting Sensing Sensitivity: 0.5 mV
Pulse Gen Serial Number: 241142

## 2021-01-25 NOTE — Progress Notes (Signed)
Remote ICD transmission.   

## 2021-03-08 ENCOUNTER — Other Ambulatory Visit: Payer: Self-pay

## 2021-03-08 ENCOUNTER — Encounter: Payer: Self-pay | Admitting: Physician Assistant

## 2021-03-08 ENCOUNTER — Ambulatory Visit (INDEPENDENT_AMBULATORY_CARE_PROVIDER_SITE_OTHER): Payer: BC Managed Care – PPO | Admitting: Internal Medicine

## 2021-03-08 ENCOUNTER — Ambulatory Visit (INDEPENDENT_AMBULATORY_CARE_PROVIDER_SITE_OTHER): Payer: BC Managed Care – PPO | Admitting: Physician Assistant

## 2021-03-08 VITALS — BP 138/60 | HR 87 | Ht 71.0 in | Wt 323.8 lb

## 2021-03-08 VITALS — BP 126/78 | HR 71 | Ht 71.0 in | Wt 323.0 lb

## 2021-03-08 DIAGNOSIS — I5022 Chronic systolic (congestive) heart failure: Secondary | ICD-10-CM | POA: Diagnosis not present

## 2021-03-08 DIAGNOSIS — I480 Paroxysmal atrial fibrillation: Secondary | ICD-10-CM

## 2021-03-08 DIAGNOSIS — I10 Essential (primary) hypertension: Secondary | ICD-10-CM

## 2021-03-08 DIAGNOSIS — E7800 Pure hypercholesterolemia, unspecified: Secondary | ICD-10-CM

## 2021-03-08 DIAGNOSIS — I5020 Unspecified systolic (congestive) heart failure: Secondary | ICD-10-CM

## 2021-03-08 DIAGNOSIS — N182 Chronic kidney disease, stage 2 (mild): Secondary | ICD-10-CM

## 2021-03-08 DIAGNOSIS — R072 Precordial pain: Secondary | ICD-10-CM

## 2021-03-08 DIAGNOSIS — Z95810 Presence of automatic (implantable) cardiac defibrillator: Secondary | ICD-10-CM | POA: Insufficient documentation

## 2021-03-08 DIAGNOSIS — G4730 Sleep apnea, unspecified: Secondary | ICD-10-CM

## 2021-03-08 MED ORDER — SACUBITRIL-VALSARTAN 97-103 MG PO TABS: 1.0000 | ORAL_TABLET | Freq: Two times a day (BID) | ORAL | 11 refills | Status: DC

## 2021-03-08 MED ORDER — CARVEDILOL 25 MG PO TABS: ORAL_TABLET | ORAL | 3 refills | Status: DC

## 2021-03-08 MED ORDER — SPIRONOLACTONE 25 MG PO TABS: ORAL_TABLET | ORAL | 1 refills | Status: DC

## 2021-03-08 MED ORDER — ATORVASTATIN CALCIUM 20 MG PO TABS: ORAL_TABLET | ORAL | 3 refills | Status: DC

## 2021-03-08 MED ORDER — FAMOTIDINE 20 MG PO TABS: 20.0000 mg | ORAL_TABLET | Freq: Two times a day (BID) | ORAL | 2 refills | Status: DC

## 2021-03-08 MED ORDER — ISOSORBIDE MONONITRATE ER 60 MG PO TB24: 60.0000 mg | ORAL_TABLET | Freq: Every day | ORAL | 3 refills | Status: DC

## 2021-03-08 MED ORDER — APIXABAN 5 MG PO TABS: 5.0000 mg | ORAL_TABLET | Freq: Two times a day (BID) | ORAL | 3 refills | Status: DC

## 2021-03-08 NOTE — Progress Notes (Signed)
Sleep Apnea Evaluation  [facility]  Today's Date: 03/08/2021   Patient Name: Richard Cochran        DOB: 02/16/1986       Height:  5\' 11"  (1.803 m)     Weight: (!) 323 lb 12.8 oz (146.9 kg)  BMI: Body mass index is 45.16 kg/m.    Referring Provider:  , PA-C   STOP-BANG RISK ASSESSMENT    STOP-BANG 03/08/2021  Do you snore loudly? Yes  Do you often feel tired, fatigued, or sleepy during the daytime? Yes  Has anyone observed you stop breathing during sleep? Yes  Do you have (or are you being treated for) high blood pressure? Yes  Recent BMI (Calculated) 45.18  Is BMI greater than 35 kg/m2? 1=Yes  Age older than 36 years old? 0=No  Has large neck size > 40 cm (15.7 in, large male shirt size, large male collar size > 16) Yes  Gender - Male 1=Yes  STOP-Bang Total Score 7      If STOP-BANG Score ?3 OR two clinical symptoms - patient qualifies for WatchPAT (CPT 95800)      Sleep study ordered due to two (2) of the following clinical symptoms/diagnoses:  Excessive daytime sleepiness G47.10  Gastroesophageal reflux K21.9  Nocturia R35.1  Morning Headaches G44.221  Difficulty concentrating R41.840  Memory problems or poor judgment G31.84  Personality changes or irritability R45.4  Loud snoring R06.83  Depression F32.9  Unrefreshed by sleep G47.8  Impotence N52.9  History of high blood pressure R03.0  Insomnia G47.00  Sleep Disordered Breathing or Sleep Apnea ICD G47.33

## 2021-03-08 NOTE — Patient Instructions (Signed)
Medication Instructions:  Your physician recommends that you continue on your current medications as directed. Please refer to the Current Medication list given to you today.  Labwork: None ordered.  Testing/Procedures: None ordered.  Follow-Up: Your physician wants you to follow-up in: one year with Lewayne Bunting, MD or one of the following Advanced Practice Providers on your designated Care Team:   Francis Dowse, [place] Casimiro Needle "Mardelle Matte" Lanna Poche, [place]  Remote monitoring is used to monitor your ICD from home. This monitoring reduces the number of office visits required to check your device to one time per year. It allows [name] to keep an eye on the functioning of your device to ensure it is working properly. You are scheduled for a device check from home on 04/12/2021. You may send your transmission at any time that day. If you have a wireless device, the transmission will be sent automatically. After your physician reviews your transmission, you will receive a postcard with your next transmission date.  Any Other Special Instructions Will Be Listed Below (If Applicable).  If you need a refill on your cardiac medications before your next appointment, please call your pharmacy.

## 2021-03-08 NOTE — Progress Notes (Signed)
HPI [name] returns today for followup. He is a pleasant morbidly obese man with a h/o chronic systolic heart failure due to a non-ischemic CM, morbid obesity, and HTN, s/p ICD insertion. He has lost 50 lbs. He denies chest pain or sob.   No Known Allergies   Current Outpatient Medications  Medication Sig Dispense Refill   acetaminophen (TYLENOL) 325 MG tablet Take 2 tablets (650 mg total) by mouth every 4 (four) hours as needed for headache or mild pain.     famotidine (PEPCID) 20 MG tablet Take 1 tablet (20 mg total) by mouth 2 (two) times daily. 180 tablet 2   isosorbide mononitrate (IMDUR) 60 MG 24 hr tablet Take 1 tablet (60 mg total) by mouth daily. 90 tablet 3   apixaban (ELIQUIS) 5 MG TABS tablet Take 1 tablet (5 mg total) by mouth 2 (two) times daily. 180 tablet 3   atorvastatin (LIPITOR) 20 MG tablet TAKE 1 TABLET BY MOUTH ONCE DAILY AT  6  PM 90 tablet 3   carvedilol (COREG) 25 MG tablet TAKE 1 & 1/2 (ONE & ONE-HALF) TABLETS BY MOUTH TWICE DAILY WITH MEALS 270 tablet 3   sacubitril-valsartan (ENTRESTO) 97-103 MG Take 1 tablet by mouth 2 (two) times daily. 60 tablet 11   spironolactone (ALDACTONE) 25 MG tablet Take 1/2 (one-half) tablet by mouth once daily 45 tablet 1   No current facility-administered medications for this visit.     Past Medical History:  Diagnosis Date   [employer] ICD (implantable cardioverter-defibrillator) in place    Childhood asthma    CKD (chronic kidney disease)    "left kidney weaker than right" (03/13/2017)   Hypertension    NSTEMI (non-ST elevated myocardial infarction) (HCC) 03/13/2017   [name] 03/13/2017   PAF (paroxysmal atrial fibrillation) (HCC)    Renal insufficiency     ROS:   All systems reviewed and negative except as noted in the HPI.   Past Surgical History:  Procedure Laterality Date   ICD IMPLANT N/A 08/05/2017   Procedure: ICD IMPLANT;  Surgeon: Marinus Maw, MD;  Location: [facility] INVASIVE CV LAB;  Service:  Cardiovascular;  Laterality: N/A;   LEFT HEART CATH AND CORONARY ANGIOGRAPHY N/A 03/14/2017   Procedure: LEFT HEART CATH AND CORONARY ANGIOGRAPHY;  Surgeon: Swazil[place], Peter M, MD;  Location: [facility] INVASIVE CV LAB;  Service: Cardiovascular;  Laterality: N/A;   NO PAST SURGERIES       Family History  Problem Relation Age of Onset   Hypertension Mother    Hypertension Father      Social History   Socioeconomic History   Marital status: Single    Spouse name: Not on file   Number of children: Not on file   Years of education: Not on file   Highest education level: Not on file  Occupational History   Not on file  Tobacco Use   Smoking status: Former    Years: 2.00    Types: Cigarettes   Smokeless tobacco: Never   Tobacco comments:    03/13/2017 "quit when I was 11 or 12"  Vaping Use   Vaping Use: Never used  Substance and Sexual Activity   Alcohol use: Yes    Comment: 03/13/2017 "I drink on my birthday; nothing else"   Drug use: No   Sexual activity: Not on file  Other Topics Concern   Not on file  Social History Narrative   Not on file   Social Determinants of  Health   Financial Resource Strain: Not on file  Food Insecurity: Not on file  Transportation Needs: Not on file  Physical Activity: Not on file  Stress: Not on file  Social Connections: Not on file  Intimate Partner Violence: Not on file     BP 126/78   Pulse 71   Ht 5\' 11"  (1.803 m)   Wt (!) 323 lb (146.5 kg)   SpO2 96%   BMI 45.05 kg/m   Physical Exam:  Well appearing NAD HEENT: Unremarkable Neck:  No JVD, no thyromegally Lymphatics:  No adenopathy Back:  No CVA tenderness Lungs:  Clear with no wheezes HEART:  Regular rate rhythm, no murmurs, no rubs, no clicks Abd:  soft, positive bowel sounds, no organomegally, no rebound, no guarding Ext:  2 plus pulses, no edema, no cyanosis, no clubbing Skin:  No rashes no nodules Neuro:  CN II through XII intact, motor grossly intact  EKG - NSR  DEVICE  - Normal device function  Assess/Plan:  Chronic systolic heart failure - his symptoms are class 2. He will continue his current meds. ICD - his device is working normally with 12 years of battery longevity remaining. Obesity - we discussed the importance of weight loss. HTN - his bp is well controlled. He will continue his current meds. I encouraged him to lose weight.  Aryan Bello,MD

## 2021-03-08 NOTE — Progress Notes (Signed)
Cardiology Office Note:    Date:  03/08/2021   ID:  Richard Cochran, DOB [birth year], MRN [id number]  PCP:  Kathyrn Lass   [facility] Providers Cardiologist:  Candee Furbish, MD  EP:  Cristopher Peru, MD     Referring MD: No ref. provider found   Chief Complaint:  Follow-up (CHF)    Patient Profile:    Richard Cochran is a 35 y.o. male with:  (HFrEF) heart failure with reduced ejection fraction Non-ischemic cardiomyopathy Cardiac catheterization in 8/18 w/ no CAD Echocardiogram 12/18: EF 10-15 S/p ICD  Paroxysmal atrial fibrillation  Chronic kidney disease  Hyperlipidemia  Hypertension  Obesity   Prior CV studies: Echocardiogram 07/15/17 Moderate concentric LVH, EF 10-15, diffuse HK, GRII DD, normal RVSF  Echocardiogram 03/14/17 EF 10-15, GR 1 DD, trivial MR, moderate LAE, normal RVSF  LEFT HEART CATH AND CORONARY ANGIOGRAPHY 03/14/2017 Narrative  There is severe left ventricular systolic dysfunction.  LV end diastolic pressure is moderately elevated.  The left ventricular ejection fraction is less than 25% by visual estimate.  1. Normal coronary anatomy 2. Marked LV enlargement with severe global hypokinesis and overall EF 15-20%. 3. Moderately elevated LVEDP  History of Present Illness: Richard Cochran was last seen by Dr. Marlou Porch in 07/2020 via Telemedicine.  He returns for f/u he is here alone.  Over the past couple of months, he has noted burning in his chest.  At times, he thinks is acid reflux.  He does take antacids with relief.  However, he also gets symptoms with exertion.  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  He will often have to stop to make the symptoms go away.  He does get short of breath with some activities.  He tells me he is afraid to sleep.  His brother notes that he snores loudly and has also witnessed apnea.  He likes to sleep on an incline.  He has not had significant edema.  He has not had syncope.        Past Medical History:   Diagnosis Date   [employer] ICD (implantable cardioverter-defibrillator) in place    Childhood asthma    CKD (chronic kidney disease)    "left kidney weaker than right" (03/13/2017)   Hypertension    NSTEMI (non-ST elevated myocardial infarction) ([place]) 03/13/2017   Archie Endo 03/13/2017   PAF (paroxysmal atrial fibrillation) (HCC)    Renal insufficiency     Current Medications: Current Meds  Medication Sig   acetaminophen (TYLENOL) 325 MG tablet Take 2 tablets (650 mg total) by mouth every 4 (four) hours as needed for headache or mild pain.   apixaban (ELIQUIS) 5 MG TABS tablet Take 1 tablet (5 mg total) by mouth 2 (two) times daily.   atorvastatin (LIPITOR) 20 MG tablet TAKE 1 TABLET BY MOUTH ONCE DAILY AT  6  PM   carvedilol (COREG) 25 MG tablet TAKE 1 & 1/2 (ONE & ONE-HALF) TABLETS BY MOUTH TWICE DAILY WITH MEALS   famotidine (PEPCID) 20 MG tablet Take 1 tablet (20 mg total) by mouth 2 (two) times daily.   sacubitril-valsartan (ENTRESTO) 97-103 MG Take 1 tablet by mouth 2 (two) times daily.   spironolactone (ALDACTONE) 25 MG tablet Take 1/2 (one-half) tablet by mouth once daily   [DISCONTINUED] isosorbide mononitrate (IMDUR) 30 MG 24 hr tablet Take 1 tablet (30 mg total) by mouth daily.     Allergies:   Patient has no known allergies.   Social History  Tobacco Use   Smoking status: Former    Years: 2.00    Types: Cigarettes   Smokeless tobacco: Never   Tobacco comments:    03/13/2017 "quit when I was 11 or 12"  Vaping Use   Vaping Use: Never used  Substance Use Topics   Alcohol use: Yes    Comment: 03/13/2017 "I drink on my birthday; nothing else"   Drug use: No     Family Hx: The patient's family history includes Hypertension in his father and mother.  Review of Systems  Gastrointestinal:  Negative for hematochezia and melena.  Genitourinary:  Negative for hematuria.    EKGs/Labs/Other Test Reviewed:    EKG:  EKG is   ordered today.  The ekg ordered today  demonstrates NSR, HR 87, normal axis, J-point elevation, QTC 413, nonspecific ST-T wave changes  Recent Labs: No results found for requested labs within last 8760 hours.   Recent Lipid Panel Lab Results  Component Value Date/Time   CHOL 130 03/31/2019 10:23 AM   TRIG 90 03/31/2019 10:23 AM   HDL 46 03/31/2019 10:23 AM   LDLCALC 67 03/31/2019 10:23 AM      Risk Assessment/Calculations:    CHA2DS2-VASc Score = 2  This indicates a 2.2% annual risk of stroke. The patient's score is based upon: CHF History: Yes HTN History: Yes Diabetes History: No Stroke History: No Vascular Disease History: No Age Score: 0 Gender Score: 0     STOP-Bang Score:  7      Physical Exam:    VS:  BP 138/60   Pulse 87   Ht _0  (1.803 m)   Wt (!) 323 lb 12.8 oz (146.9 kg)   SpO2 97%   BMI 45.16 kg/m     Wt Readings from Last 3 Encounters:  03/08/21 (!) 323 lb 12.8 oz (146.9 kg)  08/11/20 (!) 321 lb (145.6 kg)  03/31/19 (!) 336 lb 9.6 oz (152.7 kg)     Constitutional:      Appearance: Healthy appearance. Not in distress.  Neck:     Vascular: JVD normal.  Pulmonary:     Effort: Pulmonary effort is normal.     Breath sounds: No wheezing. No rales.  Cardiovascular:     Normal rate. Regular rhythm. Normal S1. Normal S2.      Murmurs: There is no murmur.  Edema:    Peripheral edema absent.  Abdominal:     Palpations: Abdomen is soft.  Skin:    General: Skin is warm and dry.  Neurological:     General: No focal deficit present.     Mental Status: Alert and oriented to person, place and time.     Cranial Nerves: Cranial nerves are intact.         ASSESSMENT & PLAN:    1. Precordial pain He has symptoms of substernal chest discomfort with exertion and at rest.  Some of his symptoms sound suspicious for acid reflux disease.  He had a normal cardiac catheterization in 2018.  He does not have acute changes on his electrocardiogram.  It is unlikely that he has developed significant  obstructive coronary disease.  However, as he does have significant exertional symptoms, I have recommended proceeding with stress testing.  I have also recommended starting on H2RA to cover for GERD.    -Lexiscan Myoview  -Increase Isosorbide to 60 mg once daily  -Famotidine 20 mg twice daily   -F/u with PCP for ?GERD  -If high risk Myoview, consider  cardiac catheterization   -F/u with Dr. Marlou Porch or me in 4-6 weeks.   2. HFrEF (heart failure with reduced ejection fraction) ([place]) S/p ICD.  EF 10-15 by echocardiogram in 2018.  NYHA II-IIb.  Volume status stable.  He has f/u with Dr. Lovena Le with EP later today.  We discussed the importance and rationale for SGLT2i therapy for CHF.  For now, increase Isosorbide as noted above.  Continue carvedilol 37.5 mg twice daily, Entresto 97/103 mg twice daily and spironolactone 12.5 mg daily.  Consider adding empagliflozin or dapagliflozin at next visit.  Obtain follow-up CMET.  3. PAF (paroxysmal atrial fibrillation) (HCC) Maintaining sinus rhythm.  He is tolerating anticoagulation.  Continue apixaban 5 mg twice daily.  Obtain follow-up CMET, CBC today.  4. Essential hypertension Fair control.  Adjust isosorbide as noted.  Continue carvedilol, Entresto, spironolactone.  5. Pure hypercholesterolemia Continue atorvastatin 20 mg daily.  He will eventually need follow-up fasting lipids arranged.  Obtain CMET today.  6. Stage 2 chronic kidney disease Obtain CMET today.  7. Morbid obesity ([place]) 8. Sleep-disordered breathing He describes several symptoms that are consistent with sleep apnea.  He notes snoring as well as fear of falling asleep.  It sounds like his brother has witnessed apnea.  He now has insurance and I will arrange home sleep study to assess for sleep apnea.    Shared Decision Making/Informed Consent The risks [chest pain, shortness of breath, cardiac arrhythmias, dizziness, blood pressure fluctuations, myocardial infarction,  stroke/transient ischemic attack, nausea, vomiting, allergic reaction, radiation exposure, metallic taste sensation and life-threatening complications (estimated to be 1 in 10,000)], benefits (risk stratification, diagnosing coronary artery disease, treatment guidance) and alternatives of a nuclear stress test were discussed in detail with Mr. Todisco and he agrees to proceed.    Dispo:  Return in about 6 weeks (around 04/19/2021) for Routine Follow Up w/ Dr. Marlou Porch, or [name], PA-C.   Medication Adjustments/Labs and Tests Ordered: Current medicines are reviewed at length with the patient today.  Concerns regarding medicines are outlined above.  Tests Ordered: Orders Placed This Encounter  Procedures   Comp Met (CMET)   CBC   Cardiac Stress Test: Informed Consent Details: Physician/Practitioner Attestation; Transcribe to consent form and obtain patient signature   Myocardial Perfusion Imaging   EKG 12-Lead   [facility] Sleep Study   Medication Changes: Meds ordered this encounter  Medications   isosorbide mononitrate (IMDUR) 60 MG 24 hr tablet    Sig: Take 1 tablet (60 mg total) by mouth daily.    Dispense:  90 tablet    Refill:  3   famotidine (PEPCID) 20 MG tablet    Sig: Take 1 tablet (20 mg total) by mouth 2 (two) times daily.    Dispense:  180 tablet    Refill:  2    Signed, [name], PA-C  03/08/2021 4:01 PM    [facility] [address] [place], [place], [place]  [address] Phone: [phone]; Fax: [phone]

## 2021-03-08 NOTE — Patient Instructions (Signed)
Medication Instructions:  INCREASE ISOSORBIDE MONONITRATE one tablet by mouth ( 60 mg) daily. START famotidine one tablet by mouth ( 20 mg) twice daily.  *If you need a refill on your cardiac medications before your next appointment, please call your pharmacy*   Lab Work: TODAY!!!!!   CMET/CBC   If you have labs (blood work) drawn today and your tests are completely normal, you will receive your results only by: MyChart Message (if you have MyChart) OR A paper copy in the mail If you have any lab test that is abnormal or we need to change your treatment, we will call you to review the results.   Testing/Procedures: You are scheduled for a Myocardial Perfusion Imaging Study on          at          .   Please arrive 15 minutes prior to your appointment time for registration and insurance purposes.   The test will take approximately 3 to 4 hours to complete; you may bring reading material. If someone comes with you to your appointment, they will need to remain in the main lobby due to limited space in the testing area.   How to prepare for your Myocardial Perfusion test:   Do not eat or drink 3 hours prior to your test, except you may have water.    Do not consume products containing caffeine (regular or decaffeinated) 12 hours prior to your test (ex: coffee, chocolate, soda, tea)   Do bring a list of your current medications with you. If not listed below, you may take your medications as normal.   Bring any held medication to your appointment, as you may be required to take it once the test is complete.   Do wear comfortable clothes (no dresses or overalls) and walking shoes. Tennis shoes are preferred. No heels or open toed shoes.  Do not wear cologne, perfume, aftershave or lotions (deodorant is allowed).   If these instructions are not followed, you test will have to be rescheduled.   Please report to [address] for your test. If you have questions or  concerns about your appointment, please call the Nuclear Lab at #[phone].  If you cannot keep your appointment, please provide 24 hour notification to the Nuclear lab to avoid a possible $50 charge to your account.   Your physician has recommended that you have a sleep study. DO NOT OPEN BOX TILL SOMEONE CALLS YOU FROM OFFICE UNTIL INSURANCE APPROVES. This test records several body functions during sleep, including: brain activity, eye movement, oxygen and carbon dioxide blood levels, heart rate and rhythm, breathing rate and rhythm, the flow of air through your mouth and nose, snoring, body muscle movements, and chest and belly movement.   Follow-Up: At [facility], you and your health needs are our priority.  As part of our continuing mission to provide you with exceptional heart care, we have created designated Provider Care Teams.  These Care Teams include your primary Cardiologist (physician) and Advanced Practice Providers (APPs -  Physician Assistants and Nurse Practitioners) who all work together to provide you with the care you need, when you need it.  We recommend signing up for the patient portal called "MyChart".  Sign up information is provided on this After Visit Summary.  MyChart is used to connect with patients for Virtual Visits (Telemedicine).  Patients are able to view lab/test results, encounter notes, upcoming appointments, etc.  Non-urgent messages can be sent  to your provider as well.   To learn more about what you can do with MyChart, go to [email or web address].    Your next appointment:   6 week(s)  The format for your next appointment:   In Person  Provider:   [name], PA-C   Other Instructions   You need to follow up with your PCP in 1-2 months.

## 2021-03-09 LAB — CBC
Hematocrit: 44.8 % (ref 37.5–51.0)
Hemoglobin: 14.3 g/dL (ref 13.0–17.7)
MCH: 27.5 pg (ref 26.6–33.0)
MCHC: 31.9 g/dL (ref 31.5–35.7)
MCV: 86 fL (ref 79–97)
Platelets: 230 10*3/uL (ref 150–450)
RBC: 5.2 x10E6/uL (ref 4.14–5.80)
RDW: 13.1 % (ref 11.6–15.4)
WBC: 9.1 10*3/uL (ref 3.4–10.8)

## 2021-03-09 LAB — COMPREHENSIVE METABOLIC PANEL
ALT: 16 IU/L (ref 0–44)
AST: 17 IU/L (ref 0–40)
Albumin/Globulin Ratio: 1.8 (ref 1.2–2.2)
Albumin: 4.5 g/dL (ref 4.0–5.0)
Alkaline Phosphatase: 73 IU/L (ref 44–121)
BUN/Creatinine Ratio: 15 (ref 9–20)
BUN: 18 mg/dL (ref 6–20)
Bilirubin Total: 1 mg/dL (ref 0.0–1.2)
CO2: 18 mmol/L — ABNORMAL LOW (ref 20–29)
Calcium: 9.2 mg/dL (ref 8.7–10.2)
Chloride: 107 mmol/L — ABNORMAL HIGH (ref 96–106)
Creatinine, Ser: 1.2 mg/dL (ref 0.76–1.27)
Globulin, Total: 2.5 g/dL (ref 1.5–4.5)
Glucose: 88 mg/dL (ref 65–99)
Potassium: 4.6 mmol/L (ref 3.5–5.2)
Sodium: 143 mmol/L (ref 134–144)
Total Protein: 7 g/dL (ref 6.0–8.5)
eGFR: 81 mL/min/{1.73_m2} (ref >59)

## 2021-03-21 ENCOUNTER — Other Ambulatory Visit: Payer: Self-pay | Admitting: Internal Medicine

## 2021-03-30 ENCOUNTER — Telehealth: Payer: Self-pay | Admitting: *Deleted

## 2021-03-30 NOTE — Telephone Encounter (Signed)
NO PA required, PER Divine G @ [employer] REF# [id number]

## 2021-03-30 NOTE — Telephone Encounter (Signed)
-----   Message from Gaynelle Cage, [place] sent at 03/09/2021  8:01 AM EDT -----  ----- Message ----- From: Debbe Bales Sent: 03/08/2021   3:28 PM EDT To: [facility] Pre Cert/Auth, #  [facility]  Snoring  Thanks danielle

## 2021-03-30 NOTE — Telephone Encounter (Signed)
Called and made the patient aware that he may proceed with the [facility] Sleep Study. PIN # provided to the patient. Patient made aware that he will be contacted after the test has been read with the results and any recommendations. Patient verbalized understanding and thanked me for the call.   

## 2021-04-06 ENCOUNTER — Telehealth (HOSPITAL_COMMUNITY): Payer: Self-pay

## 2021-04-06 NOTE — Telephone Encounter (Signed)
Spoke with the patient, he asked to reschedule due to car problems. He has been rescheduled to 10/27 and 10/28. Pt was agreeable. S.Royalty Fakhouri EMTP

## 2021-04-11 ENCOUNTER — Ambulatory Visit (HOSPITAL_COMMUNITY): Payer: BC Managed Care – PPO

## 2021-04-12 ENCOUNTER — Ambulatory Visit (INDEPENDENT_AMBULATORY_CARE_PROVIDER_SITE_OTHER): Payer: BC Managed Care – PPO

## 2021-04-12 ENCOUNTER — Ambulatory Visit (HOSPITAL_COMMUNITY): Payer: BC Managed Care – PPO

## 2021-04-12 DIAGNOSIS — I428 Other cardiomyopathies: Secondary | ICD-10-CM | POA: Diagnosis not present

## 2021-04-13 LAB — CUP PACEART REMOTE DEVICE CHECK
Battery Remaining Longevity: 144 mo
Battery Remaining Percentage: 100 %
Brady Statistic RV Percent Paced: 0 %
Date Time Interrogation Session: 20220928025000
HighPow Impedance: 72 Ohm
Implantable Lead Implant Date: 20190121
Implantable Lead Location: 753860
Implantable Lead Model: 293
Implantable Lead Serial Number: 435292
Implantable Pulse Generator Implant Date: 20190121
Lead Channel Impedance Value: 497 Ohm
Lead Channel Setting Pacing Amplitude: 2.5 V
Lead Channel Setting Pacing Pulse Width: 0.4 ms
Lead Channel Setting Sensing Sensitivity: 0.5 mV
Pulse Gen Serial Number: 241142

## 2021-04-17 NOTE — Progress Notes (Deleted)
Cardiology Office Note:    Date:  04/17/2021   ID:  Richard Cochran, DOB [birth year], MRN [id number]  PCP:  No primary care provider on file.   [facility] Providers Cardiologist:  Donato Schultz, MD { Click to update primary MD,subspecialty MD or APP then REFRESH:1}  *** Referring MD: No ref. provider found   Chief Complaint:  No chief complaint on file. {Click here for Visit Info    :1}   Patient Profile:   Richard Cochran is a 35 y.o. male with:  (HFrEF) heart failure with reduced ejection fraction Non-ischemic cardiomyopathy Cardiac catheterization in 8/18 w/ no CAD Echocardiogram 12/18: EF 10-15 S/p ICD  Paroxysmal atrial fibrillation  Chronic kidney disease  Hyperlipidemia  Hypertension  Obesity    Prior CV studies: Echocardiogram 07/15/17 Moderate concentric LVH, EF 10-15, diffuse HK, GRII DD, normal RVSF   Echocardiogram 03/14/17 EF 10-15, GR 1 DD, trivial MR, moderate LAE, normal RVSF   LEFT HEART CATH AND CORONARY ANGIOGRAPHY 03/14/2017 Narrative  There is severe left ventricular systolic dysfunction.  LV end diastolic pressure is moderately elevated.  The left ventricular ejection fraction is less than 25% by visual estimate.   1. Normal coronary anatomy 2. Marked LV enlargement with severe global hypokinesis and overall EF 15-20%. 3. Moderately elevated LVEDP   History of Present Illness: Mr. [name] was last seen 8/24.  He was having chest pain.  I added H2RA to his med regimen and increased his nitrates.  He was set up for a Myoview but it was rescheduled to later this month.  He was also set up for a sleep study at home.  It appears this is still pending.  He returns for f/u.  ***        Past Medical History:  Diagnosis Date   [employer] ICD (implantable cardioverter-defibrillator) in place    Childhood asthma    CKD (chronic kidney disease)    "left kidney weaker than right" (03/13/2017)   Hypertension    NSTEMI (non-ST elevated myocardial  infarction) (HCC) 03/13/2017   [name] 03/13/2017   PAF (paroxysmal atrial fibrillation) (HCC)    Renal insufficiency    Current Medications: No outpatient medications have been marked as taking for the 04/18/21 encounter (Appointment) with [name] T, PA-C.    Allergies:   Patient has no known allergies.   Social History   Tobacco Use   Smoking status: Former    Years: 2.00    Types: Cigarettes   Smokeless tobacco: Never   Tobacco comments:    03/13/2017 "quit when I was 11 or 12"  Vaping Use   Vaping Use: Never used  Substance Use Topics   Alcohol use: Yes    Comment: 03/13/2017 "I drink on my birthday; nothing else"   Drug use: No    Family Hx: The patient's family history includes Hypertension in his father and mother.  ROS   EKGs/Labs/Other Test Reviewed:    EKG:  EKG is *** ordered today.  The ekg ordered today demonstrates ***  Recent Labs: 03/08/2021: ALT 16; BUN 18; Creatinine, Ser 1.20; Hemoglobin 14.3; Platelets 230; Potassium 4.6; Sodium 143   Recent Lipid Panel Lab Results  Component Value Date/Time   CHOL 130 03/31/2019 10:23 AM   TRIG 90 03/31/2019 10:23 AM   HDL 46 03/31/2019 10:23 AM   LDLCALC 67 03/31/2019 10:23 AM     Risk Assessment/Calculations:   {Does this patient have ATRIAL FIBRILLATION?[phone]} STOP-Bang Score:  7  { Consider Dx Sleep  Disordered Breathing or Sleep Apnea  ICD G47.33          :1}     Physical Exam:    VS:  There were no vitals taken for this visit.    Wt Readings from Last 3 Encounters:  03/08/21 (!) 323 lb (146.5 kg)  03/08/21 (!) 323 lb 12.8 oz (146.9 kg)  08/11/20 (!) 321 lb (145.6 kg)    Physical Exam ***     ASSESSMENT & PLAN:   {Select for Dx:25819} 1. Precordial pain He has symptoms of substernal chest discomfort with exertion and at rest.  Some of his symptoms sound suspicious for acid reflux disease.  He had a normal cardiac catheterization in 2018.  He does not have acute changes on his  electrocardiogram.  It is unlikely that he has developed significant obstructive coronary disease.  However, as he does have significant exertional symptoms, I have recommended proceeding with stress testing.  I have also recommended starting on H2RA to cover for GERD.               -Lexiscan Myoview             -Increase Isosorbide to 60 mg once daily             -Famotidine 20 mg twice daily              -F/u with PCP for ?GERD             -If high risk Myoview, consider cardiac catheterization              -F/u with Dr. [name] or me in 4-6 weeks.    2. HFrEF (heart failure with reduced ejection fraction) (HCC) S/p ICD.  EF 10-15 by echocardiogram in 2018.  NYHA II-IIb.  Volume status stable.  He has f/u with Dr. [name] with EP later today.  We discussed the importance and rationale for SGLT2i therapy for CHF.  For now, increase Isosorbide as noted above.  Continue carvedilol 37.5 mg twice daily, Entresto 97/103 mg twice daily and spironolactone 12.5 mg daily.  Consider adding empagliflozin or dapagliflozin at next visit.  Obtain follow-up CMET.   3. PAF (paroxysmal atrial fibrillation) (HCC) Maintaining sinus rhythm.  He is tolerating anticoagulation.  Continue apixaban 5 mg twice daily.  Obtain follow-up CMET, CBC today.   4. Essential hypertension Fair control.  Adjust isosorbide as noted.  Continue carvedilol, Entresto, spironolactone.   5. Pure hypercholesterolemia Continue atorvastatin 20 mg daily.  He will eventually need follow-up fasting lipids arranged.  Obtain CMET today.   6. Stage 2 chronic kidney disease Obtain CMET today.   7. Morbid obesity (HCC) 8. Sleep-disordered breathing He describes several symptoms that are consistent with sleep apnea.  He notes snoring as well as fear of falling asleep.  It sounds like his brother has witnessed apnea.  He now has insurance and I will arrange home sleep study to assess for sleep apnea.    {Are you ordering a CV Procedure (e.g.  stress test, cath, DCCV, TEE, etc)?   Press F2        :[id number]}   Dispo:  No follow-ups on file.   Medication Adjustments/Labs and Tests Ordered: Current medicines are reviewed at length with the patient today.  Concerns regarding medicines are outlined above.  Tests Ordered: No orders of the defined types were placed in this encounter.  Medication Changes: No orders of the defined types were placed in this encounter.  Signed,  [name], PA-C  04/17/2021 8:30 AM    [facility] [address] [place], [place], [place]  [address] Phone: [phone]; Fax: [phone]

## 2021-04-18 ENCOUNTER — Ambulatory Visit: Payer: BC Managed Care – PPO | Admitting: Physician Assistant

## 2021-04-18 DIAGNOSIS — R072 Precordial pain: Secondary | ICD-10-CM

## 2021-04-18 DIAGNOSIS — I10 Essential (primary) hypertension: Secondary | ICD-10-CM

## 2021-04-18 DIAGNOSIS — G4730 Sleep apnea, unspecified: Secondary | ICD-10-CM

## 2021-04-18 DIAGNOSIS — N182 Chronic kidney disease, stage 2 (mild): Secondary | ICD-10-CM

## 2021-04-18 DIAGNOSIS — I480 Paroxysmal atrial fibrillation: Secondary | ICD-10-CM

## 2021-04-18 DIAGNOSIS — I5020 Unspecified systolic (congestive) heart failure: Secondary | ICD-10-CM

## 2021-04-19 NOTE — Progress Notes (Signed)
Remote ICD transmission.   

## 2021-05-11 ENCOUNTER — Ambulatory Visit (HOSPITAL_COMMUNITY): Payer: BC Managed Care – PPO

## 2021-05-12 ENCOUNTER — Ambulatory Visit (HOSPITAL_COMMUNITY): Payer: BC Managed Care – PPO

## 2021-05-26 ENCOUNTER — Ambulatory Visit: Payer: BC Managed Care – PPO | Admitting: Cardiology

## 2021-06-19 ENCOUNTER — Telehealth: Payer: Self-pay

## 2021-06-19 NOTE — Telephone Encounter (Deleted)
**Note De-Identified Shuree Brossart Obfuscation** The pt left his [employer] application for Eliquis at the office with documents.  I have completed the providers page of his application and have e-mailed all to Dr [employer] nurse so she can obtain his signature, date it, and to fax all to [employer] at the fax number written on the cover letter included or to place in the to be faxed basket in Medical Records to be faxed.

## 2021-06-19 NOTE — Telephone Encounter (Signed)
**Note De-identified Noah Lembke Obfuscation** ERROR

## 2021-06-27 ENCOUNTER — Encounter: Payer: Self-pay | Admitting: Cardiology

## 2021-06-28 ENCOUNTER — Telehealth: Payer: Self-pay | Admitting: Physician Assistant

## 2021-06-28 NOTE — Telephone Encounter (Signed)
Paged by patient after hours stating that he is returning a call.  I do not see any phone conversation.  Staff has sent messages about fax number through my chart.  I have recommended to call staff during regular business hours of 8 AM to 5 PM if any concern.  He was not having any cardiac symptoms.

## 2021-07-03 ENCOUNTER — Telehealth: Payer: Self-pay

## 2021-07-03 NOTE — Telephone Encounter (Signed)
We received a fax from [employer] requesting pt's Insurance information. I have faxed a copy of the card and the demographic page to them.

## 2021-07-04 NOTE — Telephone Encounter (Signed)
Left detailed message I am following  up on the [place] sleep study. Does not seem that the study has been completed yet. Left message if pt could please proceed with sleep study within the 2 weeks from today. If not going to proceed with sleep study will need device to be returned to the office within 2 weeks from today. Please call to update the office as to plan with sleep study.

## 2021-07-05 NOTE — Telephone Encounter (Signed)
Left detailed message x 2 for the pt, see previous notes from 07/04/21.

## 2021-07-06 ENCOUNTER — Encounter (INDEPENDENT_AMBULATORY_CARE_PROVIDER_SITE_OTHER): Payer: BC Managed Care – PPO | Admitting: Cardiology

## 2021-07-06 DIAGNOSIS — G4733 Obstructive sleep apnea (adult) (pediatric): Secondary | ICD-10-CM

## 2021-07-08 NOTE — Procedures (Signed)
° ° °  Sleep Study Report  Patient Information Study Date: 07/07/21 Patient Name: Richard Cochran Patient ID: [id number] Birth Date: [birth year] Age: 35 Gender: BMI: 45.4 (W=324 lb, H=5' 11'') Referring Physician: [name], PA  TEST DESCRIPTION: Home sleep apnea testing was completed using the WatchPat, a Type 1 device, utilizing peripheral arterial tonometry (PAT), chest movement, actigraphy, pulse oximetry, pulse rate, body position and snore. AHI was calculated with apnea and hypopnea using valid sleep time as the denominator. RDI includes apneas, hypopneas, and RERAs. The data acquired and the scoring of sleep and all associated events were performed in accordance with the recommended standards and specifications as outlined in the AASM Manual for the Scoring of Sleep and Associated Events 2.2.0 (2015).  FINDINGS: 1. Mild Obstructive Sleep Apnea with AHI 9.9/hr. 2. No Central Sleep Apnea with pAHIc 1.5/hr. 3. Oxygen desaturations as low as 83%. 4. Moderate snoring was present. O2 sats were < 88% for 3.4 min. 5. Total sleep time was 2 hrs and 37 min. 6. 0% of total sleep time was spent in REM sleep. 7. Normal sleep onset latency at 13 min. 8. No REM sleep. 9. Total awakenings were 14 .  DIAGNOSIS: Mild Obstructive Sleep Apnea (G47.33)  RECOMMENDATIONS: 1. Clinical correlation of these findings is necessary. The decision to treat obstructive sleep apnea (OSA) is usually based on the presence of apnea symptoms or the presence of associated medical conditions such as Hypertension, Congestive Heart Failure, Atrial Fibrillation or Obesity. The most common symptoms of OSA are snoring, gasping for breath while sleeping, daytime sleepiness and fatigue.  2. Initiating apnea therapy is recommended given the presence of symptoms and/or associated conditions. Recommend proceeding with one of the following:   a. Auto-CPAP therapy with a pressure range of 5-20cm H2O.   b. An oral  appliance (OA) that can be obtained from certain dentists with expertise in sleep medicine. These are primarily of use in non-obese patients with mild and moderate disease.   c. An ENT consultation which may be useful to look for specific causes of obstruction and possible treatment options.   d. If patient is intolerant to PAP therapy, consider referral to ENT for evaluation for hypoglossal nerve stimulator.  3. Close follow-up is necessary to ensure success with CPAP or oral appliance therapy for maximum benefit .  4. A follow-up oximetry study on CPAP is recommended to assess the adequacy of therapy and determine the need for supplemental oxygen or the potential need for Bi-level therapy. An arterial blood gas to determine the adequacy of baseline ventilation and oxygenation should also be considered.  5. Healthy sleep recommendations include: adequate nightly sleep (normal 7-9 hrs/night), avoidance of caffeine after noon and alcohol near bedtime, and maintaining a sleep environment that is cool, dark and quiet.  6. Weight loss for overweight patients is recommended. Even modest amounts of weight loss can significantly improve the severity of sleep apnea.  7. Snoring recommendations include: weight loss where appropriate, side sleeping, and avoidance of alcohol before bed.  8. Operation of motor vehicle should be avoided when sleepy.  Signature: Electronically Signed: 07/08/21 Armanda Magic, MD; [facility]; Diplomat, American Board of Sleep Medicine

## 2021-07-12 ENCOUNTER — Ambulatory Visit (INDEPENDENT_AMBULATORY_CARE_PROVIDER_SITE_OTHER): Payer: BC Managed Care – PPO

## 2021-07-12 DIAGNOSIS — I5022 Chronic systolic (congestive) heart failure: Secondary | ICD-10-CM

## 2021-07-12 DIAGNOSIS — I428 Other cardiomyopathies: Secondary | ICD-10-CM | POA: Diagnosis not present

## 2021-07-12 LAB — CUP PACEART REMOTE DEVICE CHECK
Battery Remaining Longevity: 138 mo
Battery Remaining Percentage: 97 %
Brady Statistic RV Percent Paced: 0 %
Date Time Interrogation Session: 20221228014200
HighPow Impedance: 69 Ohm
Implantable Lead Implant Date: 20190121
Implantable Lead Location: 753860
Implantable Lead Model: 293
Implantable Lead Serial Number: 435292
Implantable Pulse Generator Implant Date: 20190121
Lead Channel Impedance Value: 467 Ohm
Lead Channel Setting Pacing Amplitude: 2.5 V
Lead Channel Setting Pacing Pulse Width: 0.4 ms
Lead Channel Setting Sensing Sensitivity: 0.5 mV
Pulse Gen Serial Number: 241142

## 2021-07-14 ENCOUNTER — Telehealth: Payer: Self-pay

## 2021-07-14 NOTE — Telephone Encounter (Signed)
**Note De-Identified Richard Cochran Obfuscation** The pt left his completed [employer] pt asst application for Entresto at the office.  I have completed the providers page of his application and have e-mailed all to the nurse working with Dr Judd Gaudier on Tuesday 07/19/21 so she can print a Entresto 97-103 mg #180 with 3 refills have Dr [name] sign it and the application, and to either fax all to [employer] at the fax number written on the cover letter included or to place in the to be faxed basket in Medical Records to be faxed.

## 2021-07-19 ENCOUNTER — Ambulatory Visit: Payer: BC Managed Care – PPO

## 2021-07-19 DIAGNOSIS — I480 Paroxysmal atrial fibrillation: Secondary | ICD-10-CM

## 2021-07-19 DIAGNOSIS — E7800 Pure hypercholesterolemia, unspecified: Secondary | ICD-10-CM

## 2021-07-19 DIAGNOSIS — G4730 Sleep apnea, unspecified: Secondary | ICD-10-CM

## 2021-07-19 DIAGNOSIS — R072 Precordial pain: Secondary | ICD-10-CM

## 2021-07-19 DIAGNOSIS — N182 Chronic kidney disease, stage 2 (mild): Secondary | ICD-10-CM

## 2021-07-19 DIAGNOSIS — I10 Essential (primary) hypertension: Secondary | ICD-10-CM

## 2021-07-19 DIAGNOSIS — I5020 Unspecified systolic (congestive) heart failure: Secondary | ICD-10-CM

## 2021-07-20 ENCOUNTER — Telehealth (HOSPITAL_COMMUNITY): Payer: Self-pay | Admitting: *Deleted

## 2021-07-20 MED ORDER — SACUBITRIL-VALSARTAN 97-103 MG PO TABS: 1.0000 | ORAL_TABLET | Freq: Two times a day (BID) | ORAL | 3 refills | Status: DC

## 2021-07-20 NOTE — Telephone Encounter (Signed)
Patient given detailed instructions per Myocardial Perfusion Study Information Sheet for the test on 07/27/21 Patient notified to arrive 15 minutes early and that it is imperative to arrive on time for appointment to keep from having the test rescheduled. ° If you need to cancel or reschedule your appointment, please call the office within 24 hours of your appointment. . Patient verbalized understanding.Renita Brocks Jacqueline ° ° °

## 2021-07-20 NOTE — Telephone Encounter (Signed)
Reviewed Comptroller and printed for Michele Rockers, MD signature.

## 2021-07-21 NOTE — Telephone Encounter (Signed)
Dr [name] signed application and RX.  Paperwork faxed to [phone].

## 2021-07-21 NOTE — Telephone Encounter (Signed)
Sleep study completed and read by Dr. Mayford Knife.

## 2021-07-21 NOTE — Telephone Encounter (Signed)
Received confirmation of successful fax transmission.

## 2021-07-24 NOTE — Progress Notes (Signed)
Remote ICD transmission.   

## 2021-07-25 ENCOUNTER — Telehealth: Payer: Self-pay | Admitting: *Deleted

## 2021-07-25 NOTE — Telephone Encounter (Signed)
Received notification from [employer] pt has been approved and is eligible to receive Entresto until 07/15/2022 at no cost.

## 2021-07-27 ENCOUNTER — Ambulatory Visit (HOSPITAL_COMMUNITY): Payer: BC Managed Care – PPO

## 2021-07-28 ENCOUNTER — Ambulatory Visit (HOSPITAL_COMMUNITY): Payer: BC Managed Care – PPO

## 2021-08-08 ENCOUNTER — Telehealth: Payer: Self-pay | Admitting: *Deleted

## 2021-08-08 DIAGNOSIS — G4733 Obstructive sleep apnea (adult) (pediatric): Secondary | ICD-10-CM

## 2021-08-08 NOTE — Telephone Encounter (Addendum)
The patient has been notified of the result and left detailed message on voicemail and informed patient to call back. Marolyn Hammock, [place] 08/08/2021 3:02 PM     DME selection is [facility]. Patient understands he will be contacted by [facility] to set up his cpap. Patient understands to call if [facility] does not contact him with new setup in a timely manner. Patient understands they will be called once confirmation has been received from [employer]/ that they have received their new machine to schedule 10 week follow up appointment.   [facility] notified of new cpap order  Please add to airview Patient was grateful for the call and thanked me

## 2021-08-08 NOTE — Addendum Note (Signed)
Addended by: Reesa Chew on: 08/08/2021 03:40 PM   Modules accepted: Orders

## 2021-08-08 NOTE — Telephone Encounter (Signed)
-----   Message from Quintella Reichert, MD sent at 07/08/2021  6:30 AM EST ----- Please let patient know that they have sleep apnea and recommend treating with CPAP.  Please order an auto CPAP from 4-15cm H2O with heated humidity and mask of choice.  Order overnight pulse ox on CPAP.  Followup with me in 6 weeks.

## 2021-08-15 ENCOUNTER — Telehealth: Payer: Self-pay | Admitting: Internal Medicine

## 2021-08-15 NOTE — Telephone Encounter (Signed)
° °  Primary Cardiologist: Donato Schultz, MD  Chart reviewed as part of pre-operative protocol coverage. Given past medical history and time since last visit, based on ACC/AHA guidelines, Wylee Dorantes would be at acceptable risk for the planned procedure without further cardiovascular testing.   Patient with diagnosis of afib on Eliquis for anticoagulation.     Procedure: 7 dental extractions Date of procedure: TBD   CHA2DS2-VASc Score = 2  This indicates a 2.2% annual risk of stroke. The patient's score is based upon: CHF History: 1 HTN History: 1 Diabetes History: 0 Stroke History: 0 Vascular Disease History: 0 Age Score: 0 Gender Score: 0   Hx of NSTEMI on PMH but cath at the time showed normal coronary arteries.   CrCl >177mL/min Platelet count 230K   Patient does not require pre-op antibiotics for dental procedure.   Per office protocol, patient can hold Eliquis for 1-2 days prior to procedure.  I will route this recommendation to the requesting party via Epic fax function and remove from pre-op pool.  Please call with questions.  Thomasene Ripple. Carlea Badour NP-C    08/15/2021, 3:16 PM [facility] [address] Office [phone] Fax [phone]

## 2021-08-15 NOTE — Telephone Encounter (Signed)
Patient with diagnosis of afib on Eliquis for anticoagulation.    Procedure: 7 dental extractions Date of procedure: TBD  CHA2DS2-VASc Score = 2  This indicates a 2.2% annual risk of stroke. The patient's score is based upon: CHF History: 1 HTN History: 1 Diabetes History: 0 Stroke History: 0 Vascular Disease History: 0 Age Score: 0 Gender Score: 0   Hx of NSTEMI on PMH but cath at the time showed normal coronary arteries.  CrCl >113mL/min Platelet count 230K  Patient does not require pre-op antibiotics for dental procedure.  Per office protocol, patient can hold Eliquis for 1-2 days prior to procedure.

## 2021-08-15 NOTE — Telephone Encounter (Signed)
° °  Pre-operative Risk Assessment    Patient Name: Richard Cochran  DOB: 03/30/1986 MRN: [id number]     Request for Surgical Clearance    Procedure:  Dental Extraction - Amount of Teeth to be Pulled:  7 teeth extracted  Date of Surgery:  Pending Clearance                                  Surgeon:  Dr Linda Hedges Surgeon's Group or Practice Name:  [facility] Phone number:  [phone] Fax number:  [phone] 4. What type of clearance is requested?  Medical or Cardiac Clearance only?  Pharmacy Clearance Only (Request is to hold medication only)?  Or Both?  Press F2 and select the clearance requested.  If both are needed, select both from the drop down list.     :1}  Type of Clearance Requested:  Medicine - Pharmacy:  Hold Apixaban (Eliquis)     Type of Anesthesia:  Not Indicated   Additional requests/questions:    Signed, Laurence Ferrari   08/15/2021, 9:27 AM

## 2021-08-31 ENCOUNTER — Encounter (HOSPITAL_COMMUNITY): Payer: Self-pay

## 2021-08-31 ENCOUNTER — Ambulatory Visit (HOSPITAL_COMMUNITY): Payer: BC Managed Care – PPO | Attending: Cardiovascular Disease

## 2021-09-01 ENCOUNTER — Encounter (HOSPITAL_COMMUNITY): Payer: Self-pay

## 2021-09-01 ENCOUNTER — Ambulatory Visit (HOSPITAL_COMMUNITY): Payer: BC Managed Care – PPO

## 2021-09-05 ENCOUNTER — Other Ambulatory Visit (HOSPITAL_COMMUNITY): Payer: Self-pay | Admitting: Physician Assistant

## 2021-09-05 DIAGNOSIS — R079 Chest pain, unspecified: Secondary | ICD-10-CM

## 2021-09-15 ENCOUNTER — Telehealth: Payer: Self-pay | Admitting: *Deleted

## 2021-09-15 MED ORDER — APIXABAN 5 MG PO TABS: 5.0000 mg | ORAL_TABLET | Freq: Two times a day (BID) | ORAL | 1 refills | Status: DC

## 2021-09-15 NOTE — Telephone Encounter (Signed)
Prescription refill request for Eliquis received. ?Indication: Afib  ?Last office visit: 03/08/2021, taylor ?Scr: 1.20, 03/08/2021 ?Age: 36 yo  ?Weight: 146 kg  ? ?Called and clarified with patient that we have the correct phone number and mailing address. ? ?Refill sent.   ? ?Instructed pt to call and [facility] pharmacy and update his address and phone number to make sure they have the correct information on file.  ?

## 2021-10-03 ENCOUNTER — Telehealth (HOSPITAL_COMMUNITY): Payer: Self-pay

## 2021-10-03 NOTE — Telephone Encounter (Signed)
Detailed instructions left on the patient's answering machine. Asked to call back with any questions. S.Ramsha Lonigro EMTP 

## 2021-10-05 ENCOUNTER — Encounter (HOSPITAL_COMMUNITY): Payer: Self-pay

## 2021-10-05 ENCOUNTER — Ambulatory Visit (HOSPITAL_COMMUNITY): Payer: BC Managed Care – PPO

## 2021-10-06 ENCOUNTER — Encounter (HOSPITAL_COMMUNITY): Payer: Self-pay

## 2021-10-06 ENCOUNTER — Ambulatory Visit (HOSPITAL_COMMUNITY): Payer: BC Managed Care – PPO

## 2021-10-11 ENCOUNTER — Ambulatory Visit (INDEPENDENT_AMBULATORY_CARE_PROVIDER_SITE_OTHER): Payer: BC Managed Care – PPO

## 2021-10-11 DIAGNOSIS — I428 Other cardiomyopathies: Secondary | ICD-10-CM | POA: Diagnosis not present

## 2021-10-11 LAB — CUP PACEART REMOTE DEVICE CHECK
Battery Remaining Longevity: 138 mo
Battery Remaining Percentage: 97 %
Brady Statistic RV Percent Paced: 0 %
Date Time Interrogation Session: 20230329014100
HighPow Impedance: 79 Ohm
Implantable Lead Implant Date: 20190121
Implantable Lead Location: 753860
Implantable Lead Model: 293
Implantable Lead Serial Number: 435292
Implantable Pulse Generator Implant Date: 20190121
Lead Channel Impedance Value: 497 Ohm
Lead Channel Setting Pacing Amplitude: 2.5 V
Lead Channel Setting Pacing Pulse Width: 0.4 ms
Lead Channel Setting Sensing Sensitivity: 0.5 mV
Pulse Gen Serial Number: 241142

## 2021-10-12 ENCOUNTER — Encounter (HOSPITAL_COMMUNITY): Payer: Self-pay | Admitting: Physician Assistant

## 2021-10-24 NOTE — Progress Notes (Signed)
Remote ICD transmission.   

## 2021-11-09 ENCOUNTER — Other Ambulatory Visit: Payer: Self-pay | Admitting: Internal Medicine

## 2021-11-09 ENCOUNTER — Telehealth: Payer: Self-pay | Admitting: Internal Medicine

## 2021-11-09 NOTE — Telephone Encounter (Signed)
?*  STAT* If patient is at the pharmacy, call can be transferred to refill team. ? ? ?1. Which medications need to be refilled? (please list name of each medication and dose if known)  ? spironolactone (ALDACTONE) 25 MG tablet  ? ?2. Which pharmacy/location (including street and city if local pharmacy) is medication to be sent to?  ?[facility] [address] [place] - [address] ?3. Do they need a 30 day or 90 day supply?  ?90 day ? ? ?

## 2022-01-03 ENCOUNTER — Ambulatory Visit (INDEPENDENT_AMBULATORY_CARE_PROVIDER_SITE_OTHER): Payer: Self-pay

## 2022-01-03 DIAGNOSIS — I428 Other cardiomyopathies: Secondary | ICD-10-CM

## 2022-01-05 LAB — CUP PACEART REMOTE DEVICE CHECK
Battery Remaining Longevity: 138 mo
Battery Remaining Percentage: 95 %
Brady Statistic RV Percent Paced: 0 %
Date Time Interrogation Session: 20230623011400
HighPow Impedance: 69 Ohm
Implantable Lead Implant Date: 20190121
Implantable Lead Location: 753860
Implantable Lead Model: 293
Implantable Lead Serial Number: 435292
Implantable Pulse Generator Implant Date: 20190121
Lead Channel Impedance Value: 441 Ohm
Lead Channel Setting Pacing Amplitude: 2.5 V
Lead Channel Setting Pacing Pulse Width: 0.4 ms
Lead Channel Setting Sensing Sensitivity: 0.5 mV
Pulse Gen Serial Number: 241142

## 2022-01-11 ENCOUNTER — Telehealth: Payer: Self-pay | Admitting: Cardiology

## 2022-01-11 NOTE — Telephone Encounter (Signed)
Pt wants to know if Dr. [name] can fill out an FMLA form for flare ups as well when he is not feelings well enough to go to work. Please advise   Pt states he is on the way to work and may not answer but you can leave a message on cell.

## 2022-01-11 NOTE — Telephone Encounter (Signed)
Left message for Richard Cochran with Dr [name] will need to know what kind of flares up he is asking FMLA for.  Advised Richard Cochran to c/b with this information or it can be discussed at the time of his 7/14 appt with Dr [name].

## 2022-01-12 ENCOUNTER — Ambulatory Visit (INDEPENDENT_AMBULATORY_CARE_PROVIDER_SITE_OTHER): Payer: BC Managed Care – PPO

## 2022-01-12 DIAGNOSIS — I428 Other cardiomyopathies: Secondary | ICD-10-CM

## 2022-01-12 LAB — CUP PACEART REMOTE DEVICE CHECK
Battery Remaining Longevity: 138 mo
Battery Remaining Percentage: 95 %
Brady Statistic RV Percent Paced: 0 %
Date Time Interrogation Session: 20230628063700
HighPow Impedance: 64 Ohm
Implantable Lead Implant Date: 20190121
Implantable Lead Location: 753860
Implantable Lead Model: 293
Implantable Lead Serial Number: 435292
Implantable Pulse Generator Implant Date: 20190121
Lead Channel Impedance Value: 452 Ohm
Lead Channel Setting Pacing Amplitude: 2.5 V
Lead Channel Setting Pacing Pulse Width: 0.4 ms
Lead Channel Setting Sensing Sensitivity: 0.5 mV
Pulse Gen Serial Number: 241142

## 2022-01-15 NOTE — Progress Notes (Signed)
Remote ICD transmission.   

## 2022-01-17 NOTE — Telephone Encounter (Signed)
Patient returned RN's call regarding the kind of flare ups in his leg.  Attempted to contact RN but did not get a response.

## 2022-01-17 NOTE — Telephone Encounter (Signed)
Pt to discuss his request for FMLA for fatigue and heartburn at his upcoming appt with Dr [name].

## 2022-01-17 NOTE — Telephone Encounter (Signed)
Patient stated he wants his FMLA to state that he can miss work if he has flare-ups..."wakes up feeling tired or with heartburn." He said he can wait until his 7/14 appointment to discuss with Dr. [name].

## 2022-01-17 NOTE — Addendum Note (Signed)
Addended by: Geralyn Flash D on: 01/17/2022 04:50 PM   Modules accepted: Level of Service

## 2022-01-25 NOTE — Progress Notes (Signed)
Remote ICD transmission.   

## 2022-01-26 ENCOUNTER — Ambulatory Visit: Payer: BC Managed Care – PPO | Admitting: Cardiology

## 2022-01-26 ENCOUNTER — Other Ambulatory Visit: Payer: Self-pay

## 2022-01-26 ENCOUNTER — Encounter: Payer: Self-pay | Admitting: Cardiology

## 2022-01-26 ENCOUNTER — Encounter: Payer: Self-pay | Admitting: *Deleted

## 2022-01-26 VITALS — BP 140/80 | HR 55 | Ht 71.0 in | Wt 321.0 lb

## 2022-01-26 DIAGNOSIS — I480 Paroxysmal atrial fibrillation: Secondary | ICD-10-CM

## 2022-01-26 DIAGNOSIS — I428 Other cardiomyopathies: Secondary | ICD-10-CM

## 2022-01-26 DIAGNOSIS — Z79899 Other long term (current) drug therapy: Secondary | ICD-10-CM

## 2022-01-26 DIAGNOSIS — I10 Essential (primary) hypertension: Secondary | ICD-10-CM

## 2022-01-26 DIAGNOSIS — K219 Gastro-esophageal reflux disease without esophagitis: Secondary | ICD-10-CM

## 2022-01-26 MED ORDER — ATORVASTATIN CALCIUM 20 MG PO TABS: ORAL_TABLET | ORAL | 3 refills | Status: DC

## 2022-01-26 MED ORDER — PANTOPRAZOLE SODIUM 40 MG PO TBEC: 40.0000 mg | DELAYED_RELEASE_TABLET | Freq: Every day | ORAL | 11 refills | Status: DC

## 2022-01-26 MED ORDER — SPIRONOLACTONE 25 MG PO TABS: 12.5000 mg | ORAL_TABLET | Freq: Every day | ORAL | 0 refills | Status: DC

## 2022-01-26 MED ORDER — SACUBITRIL-VALSARTAN 97-103 MG PO TABS: 1.0000 | ORAL_TABLET | Freq: Two times a day (BID) | ORAL | 3 refills | Status: DC

## 2022-01-26 MED ORDER — APIXABAN 5 MG PO TABS: 5.0000 mg | ORAL_TABLET | Freq: Two times a day (BID) | ORAL | 0 refills | Status: DC

## 2022-01-26 MED ORDER — CARVEDILOL 25 MG PO TABS: ORAL_TABLET | ORAL | 3 refills | Status: DC

## 2022-01-26 MED ORDER — ISOSORBIDE MONONITRATE ER 60 MG PO TB24: 60.0000 mg | ORAL_TABLET | Freq: Every day | ORAL | 3 refills | Status: DC

## 2022-01-26 NOTE — Assessment & Plan Note (Signed)
Overall doing very well NYHA class I.  Working 10 hours 5 days to 6 days a week without any difficulty.  Carvedilol 25 twice a day isosorbide 60, Entresto high-dose 97/103, spironolactone 25  We will check a basic metabolic profile.

## 2022-01-26 NOTE — Assessment & Plan Note (Signed)
Taking Pepcid mostly once a day.  Does not seem to be helping him much.  When he lays down flat or turns on his side at night he can feel the heartburn.  It worsens when he is wearing his CPAP mask because his mouth gets dry and it seems to exacerbate this.  If he is lays down more upright his heartburn goes away.  His heart catheterization in 2018 was normal.  We will go ahead and prescribe him pantoprazole 40 mg once a day a proton pump inhibitor to help with this.

## 2022-01-26 NOTE — Telephone Encounter (Addendum)
Prescription refill request for Eliquis received. Indication: afib  Last office visit: skains, 01/26/2022 Scr: 1.20, 03/08/2021 Age: 36 yo  Weight: 145.6 kg   Refill sent.

## 2022-01-26 NOTE — Patient Instructions (Addendum)
Medication Instructions:  Please start Protonix daily. Continue all other medications as listed.  *If you need a refill on your cardiac medications before your next appointment, please call your pharmacy*  Lab:  Please have blood work today (CBC, BMP)  Follow-Up: At [facility], you and your health needs are our priority.  As part of our continuing mission to provide you with exceptional heart care, we have created designated Provider Care Teams.  These Care Teams include your primary Cardiologist (physician) and Advanced Practice Providers (APPs -  Physician Assistants and Nurse Practitioners) who all work together to provide you with the care you need, when you need it.  We recommend signing up for the patient portal called "MyChart".  Sign up information is provided on this After Visit Summary.  MyChart is used to connect with patients for Virtual Visits (Telemedicine).  Patients are able to view lab/test results, encounter notes, upcoming appointments, etc.  Non-urgent messages can be sent to your provider as well.   To learn more about what you can do with MyChart, go to [email or web address].    Your next appointment:   6 month(s)  The format for your next appointment:   In Person  Provider:   Chelsea Aus, PA-C, Jari Favre, PA-C, Ronie Spies, PA-C, Robin Searing, NP, Jacolyn Reedy, PA-C, Eligha Bridegroom, NP, or [name], PA-C     {    Important Information About Sugar

## 2022-01-26 NOTE — Assessment & Plan Note (Signed)
Currently on Eliquis.  Checking a CBC.  Prescription drug management.

## 2022-01-26 NOTE — Telephone Encounter (Addendum)
Pt got blood work today. However, due to pt's age and weight his dose will not change per dosing criteria.  Sent in refill for a 3 month supply.

## 2022-01-26 NOTE — Progress Notes (Signed)
Cardiology Office Note   Date:  01/26/2022   ID:  [name], DOB [birth year], MRN [id number]  PCP:  Patient, No Pcp Per  Cardiologist:  None  Electrophysiologist:  None   Evaluation Performed:  Follow-Up Visit  Chief Complaint: Dilated cardiomyopathy  History of Present Illness:    Richard Cochran is a 36 y.o. male here for the follow up of congestive heart failure, hypertension, and atrial fibrillation. He also wishes to discuss FMLA regarding flare-ups of "wakes up feeling tired or with heartburn" per telephone note 01/11/22.Marland Kitchen  He followed up with Richard Dopp, PA-C on 03/08/21 and reported substernal chest discomfort with exertion and at rest. Isosorbide was increased to 60 mg daily and famotidine 20 mg BID was started. He was maintaining sinus rhythm and tolerating apixaban 5 mg BID. As he had obtained insurance and had symptoms consistent with sleep apnea he was recommended for a home sleep study.   On 03/08/21 he also followed up with Dr. Lovena Le, where his device was working normally with 12 years of battery longevity remaining.  Previously here with dilated cardiomyopathy, 10-15% EF, wearing Life Vest, NICM. ECHO 03/14/17 and 07/15/17 showing EF once again of 10-15%. Likely HTN cardiomyopathy.  No shock from LifeVest.   No CAD on cardiac catheterization 02/2017.  He has a family member who previously accompanied him, works in the device clinic at [facility].   02/14/2018- no orthopnea no PND.  He does have apnea-like symptoms at night his significant other states.  Cannot afford sleep study because he does not have insurance.  It would cost him $2000 out of pocket.  Overall no chest pain.  He has excellent battery life on his ICD.  Dr. Lovena Le is monitoring.  He had atrial fibrillation in the hospital setting.  He is on Eliquis.  No bleeding.   03/31/2019-here for the follow-up of cardiomyopathy-last EF was 15%.  Defibrillator in place.  On goal-directed therapy with medications as below.   He is tolerating these well.  Occasionally when bending over and getting up or getting up quickly he may feel some transient dizziness.  No syncope.  Tolerating the meds well.  No bleeding fevers chills nausea vomiting syncope.  He is now living in [place] helping to take care of his child.   08/11/2020-here for follow-up of cardiomyopathy.  Doing very well.  NYHA class I-II symptoms.  No defibrillations.  Feels great.  He is down in [place] currently.  Taking his medications he states.  Asking for some blood work.  Today: Here for the follow up of congestive heart failure, hypertension, and atrial fibrillation. He also wishes to discuss FMLA regarding flare-ups of "wakes up feeling tired or with heartburn" per telephone note 01/11/22.  Lately he has been working on weight loss. He has been successful, he is 321 lbs in clinic today. Generally he does alright with moving around and ambulation. His breathing is stable, such as when walking to the car. He is also working 5-6 days a week.  His main concern today is suffering from nocturnal heartburn, which he also attributes to the use of his CPAP. Initially while sleeping he develops a dry throat with subsequent heartburn, so he stops using the CPAP. If he sleeps with his torso propped up somewhat his symptoms are improved or resolved. He has been taking TUMS twice a day, which works but also causes some constipation. He also takes Pepcid once a day in the morning, he wonders if he should try  taking it later in the day.  Regarding his diet he does well with avoiding sugar.  He denies any palpitations, chest pain, shortness of breath, or peripheral edema. No lightheadedness, headaches, syncope, orthopnea, or PND.   Past Medical History:  Diagnosis Date   [employer] ICD (implantable cardioverter-defibrillator) in place    Childhood asthma    CKD (chronic kidney disease)    "left kidney weaker than right" (03/13/2017)   Hypertension     NSTEMI (non-ST elevated myocardial infarction) (HCC) 03/13/2017   [name] 03/13/2017   PAF (paroxysmal atrial fibrillation) (HCC)    Renal insufficiency    Past Surgical History:  Procedure Laterality Date   ICD IMPLANT N/A 08/05/2017   Procedure: ICD IMPLANT;  Surgeon: Marinus Maw, MD;  [facility];  Service: Cardiovascular;  Laterality: N/A;   LEFT HEART CATH AND CORONARY ANGIOGRAPHY N/A 03/14/2017   Procedure: LEFT HEART CATH AND CORONARY ANGIOGRAPHY;  Surgeon: Swaziland, Peter M, MD;  Location: [facility] INVASIVE CV LAB;  Service: Cardiovascular;  Laterality: N/A;   NO PAST SURGERIES       Current Meds  Medication Sig   acetaminophen (TYLENOL) 325 MG tablet Take 2 tablets (650 mg total) by mouth every 4 (four) hours as needed for headache or mild pain.   apixaban (ELIQUIS) 5 MG TABS tablet Take 1 tablet (5 mg total) by mouth 2 (two) times daily.   atorvastatin (LIPITOR) 20 MG tablet TAKE 1 TABLET BY MOUTH ONCE DAILY AT  6  PM   carvedilol (COREG) 25 MG tablet TAKE 1 & 1/2 (ONE & ONE-HALF) TABLETS BY MOUTH TWICE DAILY WITH MEALS   famotidine (PEPCID) 20 MG tablet Take 1 tablet (20 mg total) by mouth 2 (two) times daily. (Patient taking differently: Take 20 mg by mouth daily.)   isosorbide mononitrate (IMDUR) 60 MG 24 hr tablet Take 1 tablet (60 mg total) by mouth daily.   pantoprazole (PROTONIX) 40 MG tablet Take 1 tablet (40 mg total) by mouth daily.   sacubitril-valsartan (ENTRESTO) 97-103 MG Take 1 tablet by mouth 2 (two) times daily.   spironolactone (ALDACTONE) 25 MG tablet Take 1/2 (one-half) tablet by mouth once daily     Allergies:   Patient has no known allergies.   Social History   Tobacco Use   Smoking status: Former    Years: 2.00    Types: Cigarettes   Smokeless tobacco: Never   Tobacco comments:    03/13/2017 "quit when I was 11 or 12"  Vaping Use   Vaping Use: Never used  Substance Use Topics   Alcohol use: Yes    Comment: 03/13/2017 "I drink on my  birthday; nothing else"   Drug use: No     Family Hx: The patient's family history includes Hypertension in his father and mother.   ROS:   Please see the history of present illness.    (+) Acid reflux (+) Dry throat (+) Constipation All other systems reviewed and are negative.   Other Studies Reviewed:  ICD Implant [facility])  08/05/2017: CONCLUSIONS:   1. Non-ischemic cardiomyopathy with chronic New York Heart Association class III heart failure.   2. Successful ICD implantation.   3.  No early apparent complications.   Echocardiogram  07/15/2017: Study Conclusions   - Left ventricle: The cavity size was severely dilated. There was    moderate concentric hypertrophy. Systolic function was severely    reduced. The estimated ejection fraction was in the range of 10%  to 15%. Diffuse hypokinesis. Features are consistent with a    pseudonormal left ventricular filling pattern, with concomitant    abnormal relaxation and increased filling pressure (grade 2    diastolic dysfunction). Doppler parameters are consistent with    high ventricular filling pressure.  - Aortic valve: Transvalvular velocity was within the normal range.    There was no stenosis. There was no regurgitation.  - Mitral valve: Transvalvular velocity was within the normal range.    There was no evidence for stenosis. There was no regurgitation.  - Right ventricle: The cavity size was normal. Wall thickness was    normal. Systolic function was normal.  - Tricuspid valve: There was no regurgitation.   Left Heart Cath  03/14/2017: There is severe left ventricular systolic dysfunction. LV end diastolic pressure is moderately elevated. The left ventricular ejection fraction is less than 25% by visual estimate.   1. Normal coronary anatomy 2. Marked LV enlargement with severe global hypokinesis and overall EF 15-20%. 3. Moderately elevated LVEDP   Plan: aggressive therapy for CHF.  EKG:  EKG is  personally reviewed. 01/26/2022:  Sinus bradycardia. Rate 55 bpm.   Recent Labs: 03/08/2021: ALT 16; BUN 18; Creatinine, Ser 1.20; Hemoglobin 14.3; Platelets 230; Potassium 4.6; Sodium 143   Recent Lipid Panel Lab Results  Component Value Date/Time   CHOL 130 03/31/2019 10:23 AM   TRIG 90 03/31/2019 10:23 AM   HDL 46 03/31/2019 10:23 AM   CHOLHDL 2.8 03/31/2019 10:23 AM   CHOLHDL 3.3 03/14/2017 02:14 AM   LDLCALC 67 03/31/2019 10:23 AM    Wt Readings from Last 3 Encounters:  01/26/22 (!) 321 lb (145.6 kg)  03/08/21 (!) 323 lb (146.5 kg)  03/08/21 (!) 323 lb 12.8 oz (146.9 kg)     Risk Assessment/Calculations:      Objective:    VS:  BP 140/80 (BP Location: Left Arm, Patient Position: Sitting, Cuff Size: Normal)   Pulse (!) 55   Ht 5\' 11"  (1.803 m)   Wt (!) 321 lb (145.6 kg)   SpO2 98%   BMI 44.77 kg/m     Wt Readings from Last 3 Encounters:  01/26/22 (!) 321 lb (145.6 kg)  03/08/21 (!) 323 lb (146.5 kg)  03/08/21 (!) 323 lb 12.8 oz (146.9 kg)     GEN: Well nourished, well developed in no acute distress HEENT: Normal NECK: No JVD; No carotid bruits LYMPHATICS: No lymphadenopathy CARDIAC: RRR, no murmurs, rubs, gallops RESPIRATORY:  Clear to auscultation without rales, wheezing or rhonchi  ABDOMEN: Soft, non-tender, non-distended MUSCULOSKELETAL:  No edema; No deformity  SKIN: Warm and dry NEUROLOGIC:  Alert and oriented x 3 PSYCHIATRIC:  Normal affect   ASSESSMENT & PLAN:    GERD (gastroesophageal reflux disease) Taking Pepcid mostly once a day.  Does not seem to be helping him much.  When he lays down flat or turns on his side at night he can feel the heartburn.  It worsens when he is wearing his CPAP mask because his mouth gets dry and it seems to exacerbate this.  If he is lays down more upright his heartburn goes away.  His heart catheterization in 2018 was normal.  We will go ahead and prescribe him pantoprazole 40 mg once a day a proton pump inhibitor to  help with this.  Acute on chronic combined systolic and diastolic CHF (congestive heart failure) (HCC) Overall doing very well NYHA class I.  Working 10 hours 5 days to 6 days a  week without any difficulty.  Carvedilol 25 twice a day isosorbide 60, Entresto high-dose 97/103, spironolactone 25  We will check a basic metabolic profile.  Paroxysmal atrial fibrillation (HCC) Currently on Eliquis.  Checking a CBC.  Prescription drug management.  NICM (nonischemic cardiomyopathy) ([place]) No CAD on cath 2018.    Medication Adjustments/Labs and Tests Ordered: Current medicines are reviewed at length with the patient today.  Concerns regarding medicines are outlined above.   Tests Ordered: Orders Placed This Encounter  Procedures   CBC   Basic metabolic panel   EKG [id number]   Medication Changes: Meds ordered this encounter  Medications   pantoprazole (PROTONIX) 40 MG tablet    Sig: Take 1 tablet (40 mg total) by mouth daily.    Dispense:  30 tablet    Refill:  11    Follow Up:  6 months with APP.  I,Mathew Stumpf,acting as a Education administrator for [employer], MD.,have documented all relevant documentation on the behalf of Candee Furbish, MD,as directed by  Candee Furbish, MD while in the presence of Candee Furbish, MD.  I, Candee Furbish, MD, have reviewed all documentation for this visit. The documentation on 01/26/22 for the exam, diagnosis, procedures, and orders are all accurate and complete.   Signed, Candee Furbish, MD  01/26/2022 9:39 AM    [facility]

## 2022-01-26 NOTE — Assessment & Plan Note (Addendum)
No CAD on cath 2018.

## 2022-01-27 ENCOUNTER — Other Ambulatory Visit: Payer: Self-pay | Admitting: Cardiology

## 2022-01-27 LAB — CBC
Hematocrit: 41.5 % (ref 37.5–51.0)
Hemoglobin: 13.3 g/dL (ref 13.0–17.7)
MCH: 27.7 pg (ref 26.6–33.0)
MCHC: 32 g/dL (ref 31.5–35.7)
MCV: 87 fL (ref 79–97)
Platelets: 202 10*3/uL (ref 150–450)
RBC: 4.8 x10E6/uL (ref 4.14–5.80)
RDW: 13.1 % (ref 11.6–15.4)
WBC: 8.6 10*3/uL (ref 3.4–10.8)

## 2022-01-27 LAB — BASIC METABOLIC PANEL
BUN/Creatinine Ratio: 9 (ref 9–20)
BUN: 12 mg/dL (ref 6–20)
CO2: 26 mmol/L (ref 20–29)
Calcium: 9.2 mg/dL (ref 8.7–10.2)
Chloride: 105 mmol/L (ref 96–106)
Creatinine, Ser: 1.31 mg/dL — ABNORMAL HIGH (ref 0.76–1.27)
Glucose: 93 mg/dL (ref 70–99)
Potassium: 4.1 mmol/L (ref 3.5–5.2)
Sodium: 142 mmol/L (ref 134–144)
eGFR: 72 mL/min/{1.73_m2} (ref >59)

## 2022-01-29 MED ORDER — SACUBITRIL-VALSARTAN 97-103 MG PO TABS: 1.0000 | ORAL_TABLET | Freq: Two times a day (BID) | ORAL | 3 refills | Status: DC

## 2022-01-29 NOTE — Addendum Note (Signed)
Addended by: Margaret Pyle D on: 01/29/2022 08:35 AM   Modules accepted: Orders

## 2022-02-13 ENCOUNTER — Telehealth: Payer: Self-pay | Admitting: Cardiology

## 2022-02-13 NOTE — Telephone Encounter (Signed)
Patient is following up on his FMLA paperwork.  He stated it was sent on 7/27 but will re-send for Dr. [name] signature.

## 2022-02-13 NOTE — Telephone Encounter (Signed)
Pt is aware paperwork was completed, signed by Dr [name] and faxed back as requested on 02/09/2022. He states he was just "making sure" and expressed gratitude for the assistance.

## 2022-03-14 ENCOUNTER — Other Ambulatory Visit: Payer: Self-pay | Admitting: Cardiology

## 2022-03-14 DIAGNOSIS — I480 Paroxysmal atrial fibrillation: Secondary | ICD-10-CM

## 2022-03-14 MED ORDER — SPIRONOLACTONE 25 MG PO TABS: 12.5000 mg | ORAL_TABLET | Freq: Every day | ORAL | 3 refills | Status: DC

## 2022-03-14 MED ORDER — APIXABAN 5 MG PO TABS: 5.0000 mg | ORAL_TABLET | Freq: Two times a day (BID) | ORAL | 1 refills | Status: DC

## 2022-03-14 NOTE — Telephone Encounter (Signed)
Prescription refill request for Eliquis received. Indication: Afib  Last office visit: 01/26/22 [name])  Scr: 1.31 (01/26/22)  Age: 36 Weight: 145.6kg  Appropriate dose and refill sent to requested pharmacy.

## 2022-03-22 ENCOUNTER — Encounter: Payer: Self-pay | Admitting: Cardiology

## 2022-03-22 ENCOUNTER — Ambulatory Visit: Payer: BC Managed Care – PPO | Attending: Cardiology | Admitting: Cardiology

## 2022-03-22 VITALS — BP 145/72 | Ht 71.0 in | Wt 319.0 lb

## 2022-03-22 DIAGNOSIS — I10 Essential (primary) hypertension: Secondary | ICD-10-CM | POA: Diagnosis not present

## 2022-03-22 DIAGNOSIS — G4733 Obstructive sleep apnea (adult) (pediatric): Secondary | ICD-10-CM | POA: Diagnosis not present

## 2022-03-22 NOTE — Progress Notes (Signed)
SLEEP MEDICINE VIRTUAL CONSULT NOTE via Video Note   Because of Richard Cochran's co-morbid illnesses, he is at least at moderate risk for complications without adequate follow up.  This format is felt to be most appropriate for this patient at this time.  All issues noted in this document were discussed and addressed.  A limited physical exam was performed with this format.  Please refer to the patient's chart for his consent to telehealth for [facility].      Date:  03/22/2022   ID:  Richard Cochran, DOB 1985/12/28, MRN [id number] The patient was identified using 2 identifiers.  Patient Location: Home Provider Location: Office/Clinic   PCP:  Patient, No Pcp Per   [facility] Providers Cardiologist:  Donato Schultz, MD Sleep Medicine:  Armanda Magic, MD     Evaluation Performed:  New Patient Evaluation  Chief Complaint: Obstructive sleep apnea  History of Present Illness:    Richard Cochran is a 36 y.o. male who is being seen today for the evaluation of obstructive sleep apnea at the request of Donato Schultz, MD.  Richard Cochran is a 36 y.o. male with a history 3 of CKD, hypertension, NSTEMI, PAF and ICD who was referred for home sleep study in December 2022.  He tells me that he has problems going to sleep.  He is scared to fall asleep that has occurred since he had his ICD was placed.  He goes to bed at 4am and falls asleep 30 minutes after that and then wakes up at 10am or 11am to go to work at 1:40pm.  He cannot get to sleep because he is scared that he wont wake up but does not know why he has that fear but thinks it is related to his device. His HST revealed mild obstructive sleep apnea with an AHI of 9.9/h and O2 saturations as low as 83%.  He was started on auto CPAP from 4 to 15 cm H2O and is now here for follow-up.  He does not like his CPAP device.  He tells me that he wakes up with a dry throat and feels like he has heartburn.  If he does not sleep with it  he wakes ups feeling tired but no burning in his throat.  He uses a full face mask.  He is using distilled water.  He has not adjusted the humidity yet.   Past Medical History:  Diagnosis Date   [employer] ICD (implantable cardioverter-defibrillator) in place    Childhood asthma    CKD (chronic kidney disease)    "left kidney weaker than right" (03/13/2017)   Hypertension    NSTEMI (non-ST elevated myocardial infarction) (HCC) 03/13/2017   [name] 03/13/2017   PAF (paroxysmal atrial fibrillation) (HCC)    Renal insufficiency    Past Surgical History:  Procedure Laterality Date   ICD IMPLANT N/A 08/05/2017   Procedure: ICD IMPLANT;  Surgeon: Marinus Maw, MD;  Location: [facility] INVASIVE CV LAB;  Service: Cardiovascular;  Laterality: N/A;   LEFT HEART CATH AND CORONARY ANGIOGRAPHY N/A 03/14/2017   Procedure: LEFT HEART CATH AND CORONARY ANGIOGRAPHY;  Surgeon: Swaziland, Peter M, MD;  Location: [facility] INVASIVE CV LAB;  Service: Cardiovascular;  Laterality: N/A;   NO PAST SURGERIES       Current Meds  Medication Sig   acetaminophen (TYLENOL) 325 MG tablet Take 2 tablets (650 mg total) by mouth every 4 (four) hours as needed for headache or mild pain.  apixaban (ELIQUIS) 5 MG TABS tablet Take 1 tablet (5 mg total) by mouth 2 (two) times daily.   atorvastatin (LIPITOR) 20 MG tablet TAKE 1 TABLET BY MOUTH ONCE DAILY AT  6  PM   carvedilol (COREG) 25 MG tablet TAKE 1 & 1/2 (ONE & ONE-HALF) TABLETS BY MOUTH TWICE DAILY WITH MEALS   famotidine (PEPCID) 20 MG tablet Take 1 tablet (20 mg total) by mouth 2 (two) times daily. (Patient taking differently: Take 20 mg by mouth daily.)   isosorbide mononitrate (IMDUR) 60 MG 24 hr tablet Take 1 tablet (60 mg total) by mouth daily.   pantoprazole (PROTONIX) 40 MG tablet Take 1 tablet (40 mg total) by mouth daily.   sacubitril-valsartan (ENTRESTO) 97-103 MG Take 1 tablet by mouth 2 (two) times daily.   spironolactone (ALDACTONE) 25 MG tablet Take 0.5 tablets  (12.5 mg total) by mouth daily.     Allergies:   Patient has no known allergies.   Social History   Tobacco Use   Smoking status: Former    Years: 2.00    Types: Cigarettes   Smokeless tobacco: Never   Tobacco comments:    03/13/2017 "quit when I was 11 or 12"  Vaping Use   Vaping Use: Never used  Substance Use Topics   Alcohol use: Yes    Comment: 03/13/2017 "I drink on my birthday; nothing else"   Drug use: No     Family Hx: The patient's family history includes Hypertension in his father and mother.  ROS:   Please see the history of present illness.     All other systems reviewed and are negative.   Prior Sleep studies:   The following studies were reviewed today:  Home sleep study and PAP compliance download  Labs/Other Tests and Data Reviewed:     Recent Labs: 01/26/2022: BUN 12; Creatinine, Ser 1.31; Hemoglobin 13.3; Platelets 202; Potassium 4.1; Sodium 142    Wt Readings from Last 3 Encounters:  03/22/22 (!) 319 lb (144.7 kg)  01/26/22 (!) 321 lb (145.6 kg)  03/08/21 (!) 323 lb (146.5 kg)     Risk Assessment/Calculations:    CHA2DS2-VASc Score = 2   This indicates a 2.2% annual risk of stroke. The patient's score is based upon: CHF History: 1 HTN History: 1 Diabetes History: 0 Stroke History: 0 Vascular Disease History: 0 Age Score: 0 Gender Score: 0     STOP-Bang Score:  6       Objective:    Vital Signs:  BP (!) 145/72   Ht 5\' 11"  (1.803 m)   Wt (!) 319 lb (144.7 kg)   BMI 44.49 kg/m    VITAL SIGNS:  reviewed GEN:  no acute distress EYES:  sclerae anicteric, EOMI - Extraocular Movements Intact RESPIRATORY:  normal respiratory effort, symmetric expansion CARDIOVASCULAR:  no peripheral edema SKIN:  no rash, lesions or ulcers. MUSCULOSKELETAL:  no obvious deformities. NEURO:  alert and oriented x 3, no obvious focal deficit PSYCH:  normal affect  ASSESSMENT & PLAN:    OSA - The patient is tolerating PAP therapy well without any  problems. The PAP download performed by his DME was personally reviewed and interpreted by me today and showed an AHI of 5.5 /hr on auto CPAP from 4-15 cm H2O with 23% compliance in using more than 4 hours nightly.  The patient has been using and benefiting from PAP use and will continue to benefit from therapy.  -I have recommended that he try to be  more compliant with his device -Since his AHI is above 5.5 he will increase his auto CPAP to 4 - 20 cm H2O -I have asked him to adjust his humidity to help with mouth dryness and sort throat  Hypertension -Blood pressure is borderline controlled on exam today but his PCP is managing his BP meds and he has followup in a few weeks. -Continue prescription drug management with carvedilol 37.5 mg twice daily, Imdur 60 mg daily and Entresto 97-23 mg twice daily with as needed   Time:   Today, I have spent 15 minutes with the patient with telehealth technology discussing the above problems.     Medication Adjustments/Labs and Tests Ordered: Current medicines are reviewed at length with the patient today.  Concerns regarding medicines are outlined above.   Tests Ordered: No orders of the defined types were placed in this encounter.   Medication Changes: No orders of the defined types were placed in this encounter.   Follow Up:  Virtual Visit  in 8 week(s)  Signed, Armanda Magic, MD  03/22/2022 11:30 AM    [facility]

## 2022-03-22 NOTE — Patient Instructions (Addendum)
Medication Instructions:  Your physician recommends that you continue on your current medications as directed. Please refer to the Current Medication list given to you today.  *If you need a refill on your cardiac medications before your next appointment, please call your pharmacy*  Follow-Up: At [facility], you and your health needs are our priority.  As part of our continuing mission to provide you with exceptional heart care, we have created designated Provider Care Teams.  These Care Teams include your primary Cardiologist (physician) and Advanced Practice Providers (APPs -  Physician Assistants and Nurse Practitioners) who all work together to provide you with the care you need, when you need it.  Your next appointment:   10/30 at 10:00am  The format for your next appointment:   Virtual Visit   Provider:   Armanda Magic, MD  Important Information About Sugar

## 2022-03-23 ENCOUNTER — Telehealth: Payer: Self-pay | Admitting: *Deleted

## 2022-03-23 DIAGNOSIS — I10 Essential (primary) hypertension: Secondary | ICD-10-CM

## 2022-03-23 DIAGNOSIS — I5022 Chronic systolic (congestive) heart failure: Secondary | ICD-10-CM

## 2022-03-23 DIAGNOSIS — G4733 Obstructive sleep apnea (adult) (pediatric): Secondary | ICD-10-CM

## 2022-03-23 NOTE — Telephone Encounter (Signed)
Order placed to [employer] via community message 

## 2022-03-23 NOTE — Telephone Encounter (Signed)
-----   Message from Theresia Majors, RN sent at 03/22/2022 11:37 AM EDT ----- Per Dr. Mayford Knife: Please increase his auto CPAP to 4 - 20 cm H2O and get a download in 4 weeks.  Thanks!

## 2022-03-29 ENCOUNTER — Other Ambulatory Visit: Payer: Self-pay

## 2022-03-29 MED ORDER — FAMOTIDINE 20 MG PO TABS: 20.0000 mg | ORAL_TABLET | Freq: Two times a day (BID) | ORAL | 3 refills | Status: DC

## 2022-03-29 MED ORDER — SPIRONOLACTONE 25 MG PO TABS: 12.5000 mg | ORAL_TABLET | Freq: Every day | ORAL | 3 refills | Status: DC

## 2022-03-29 NOTE — Telephone Encounter (Signed)
Pt's medication was sent to pt's pharmacy as requested. Confirmation received.  °

## 2022-04-13 ENCOUNTER — Ambulatory Visit (INDEPENDENT_AMBULATORY_CARE_PROVIDER_SITE_OTHER): Payer: BC Managed Care – PPO

## 2022-04-13 DIAGNOSIS — I428 Other cardiomyopathies: Secondary | ICD-10-CM | POA: Diagnosis not present

## 2022-04-18 LAB — CUP PACEART REMOTE DEVICE CHECK
Battery Remaining Longevity: 132 mo
Battery Remaining Percentage: 93 %
Brady Statistic RV Percent Paced: 0 %
Date Time Interrogation Session: 20231003103000
HighPow Impedance: 71 Ohm
Implantable Lead Implant Date: 20190121
Implantable Lead Location: 753860
Implantable Lead Model: 293
Implantable Lead Serial Number: 435292
Implantable Pulse Generator Implant Date: 20190121
Lead Channel Impedance Value: 478 Ohm
Lead Channel Setting Pacing Amplitude: 2.5 V
Lead Channel Setting Pacing Pulse Width: 0.4 ms
Lead Channel Setting Sensing Sensitivity: 0.5 mV
Pulse Gen Serial Number: 241142

## 2022-04-19 NOTE — Progress Notes (Signed)
Remote ICD transmission.   

## 2022-05-14 ENCOUNTER — Ambulatory Visit: Payer: BC Managed Care – PPO | Attending: Cardiology | Admitting: Cardiology

## 2022-06-29 ENCOUNTER — Telehealth: Payer: Self-pay | Admitting: Cardiology

## 2022-06-29 ENCOUNTER — Encounter: Payer: Self-pay | Admitting: Cardiology

## 2022-06-29 NOTE — Telephone Encounter (Signed)
-----   Message -----  From: Ethelda Chick, RN  Sent: 05/14/2022   9:53 AM EST  To: Reesa Chew, CMA; Garald Braver   Hello,   I didn't know who to send this to, but patient needs rescheduling either this week or next week. Dr. Mayford Knife stated she was going to try to add in on 9th or 10th. I didn't want to cancel his appointment for today, until he had him rescheduled.   Thank you,  Pam, RN    LVM x3 with no success in scheduling, will send letter

## 2022-07-13 ENCOUNTER — Ambulatory Visit (INDEPENDENT_AMBULATORY_CARE_PROVIDER_SITE_OTHER): Payer: BC Managed Care – PPO

## 2022-07-13 DIAGNOSIS — I428 Other cardiomyopathies: Secondary | ICD-10-CM

## 2022-07-17 LAB — CUP PACEART REMOTE DEVICE CHECK
Battery Remaining Longevity: 132 mo
Battery Remaining Percentage: 93 %
Brady Statistic RV Percent Paced: 0 %
Date Time Interrogation Session: 20231231011900
HighPow Impedance: 69 Ohm
Implantable Lead Connection Status: 753985
Implantable Lead Implant Date: 20190121
Implantable Lead Location: 753860
Implantable Lead Model: 293
Implantable Lead Serial Number: 435292
Implantable Pulse Generator Implant Date: 20190121
Lead Channel Impedance Value: 458 Ohm
Lead Channel Setting Pacing Amplitude: 2.5 V
Lead Channel Setting Pacing Pulse Width: 0.4 ms
Lead Channel Setting Sensing Sensitivity: 0.5 mV
Pulse Gen Serial Number: 241142
Zone Setting Status: 755011

## 2022-07-30 NOTE — Progress Notes (Signed)
Remote ICD transmission.   

## 2022-08-10 ENCOUNTER — Telehealth: Payer: Self-pay

## 2022-08-10 NOTE — Telephone Encounter (Signed)
**Note De-Identified Ivianna [place] [employer]bfuscation** The pt faxed his NPAF application for Entresto to [name] on 1/24. I have completed the providers page of his application and have e-mailed it to Dr Marlou Porch nurse so she can obtain Dr Marlou Porch nurse, date it, ans to fax all to NPAF at the fax number written on the cover letter included.

## 2022-08-10 NOTE — Telephone Encounter (Signed)
Faxed to # on cover sheet.

## 2022-08-10 NOTE — Telephone Encounter (Signed)
Received email, printed and given to Dr Marlou Porch to sign.

## 2022-08-30 NOTE — Telephone Encounter (Signed)
**Note De-Identified At[place]cock [employer]uscation** Letter received from NPAF Marian Meneely fax stating that they have approved the pt for Entresto assistance until 08/24/2023. Pt ID: [id number]  The letter states that they have notified the pt of this approval as well.

## 2022-10-12 ENCOUNTER — Ambulatory Visit (INDEPENDENT_AMBULATORY_CARE_PROVIDER_SITE_OTHER): Payer: BC Managed Care – PPO

## 2022-10-12 DIAGNOSIS — I428 Other cardiomyopathies: Secondary | ICD-10-CM

## 2022-10-16 LAB — CUP PACEART REMOTE DEVICE CHECK
Battery Remaining Longevity: 132 mo
Battery Remaining Percentage: 94 %
Brady Statistic RV Percent Paced: 0 %
Date Time Interrogation Session: 20240401234900
HighPow Impedance: 66 Ohm
Implantable Lead Connection Status: 753985
Implantable Lead Implant Date: 20190121
Implantable Lead Location: 753860
Implantable Lead Model: 293
Implantable Lead Serial Number: 435292
Implantable Pulse Generator Implant Date: 20190121
Lead Channel Impedance Value: 461 Ohm
Lead Channel Setting Pacing Amplitude: 2.5 V
Lead Channel Setting Pacing Pulse Width: 0.4 ms
Lead Channel Setting Sensing Sensitivity: 0.5 mV
Pulse Gen Serial Number: 241142
Zone Setting Status: 755011

## 2022-11-13 NOTE — Progress Notes (Signed)
Remote ICD transmission.   

## 2023-01-11 ENCOUNTER — Ambulatory Visit (INDEPENDENT_AMBULATORY_CARE_PROVIDER_SITE_OTHER): Payer: BC Managed Care – PPO

## 2023-01-11 DIAGNOSIS — I428 Other cardiomyopathies: Secondary | ICD-10-CM | POA: Diagnosis not present

## 2023-01-11 LAB — CUP PACEART REMOTE DEVICE CHECK
Battery Remaining Longevity: 126 mo
Battery Remaining Percentage: 86 %
Brady Statistic RV Percent Paced: 0 %
Date Time Interrogation Session: 20240628082600
HighPow Impedance: 66 Ohm
Implantable Lead Connection Status: 753985
Implantable Lead Implant Date: 20190121
Implantable Lead Location: 753860
Implantable Lead Model: 293
Implantable Lead Serial Number: 435292
Implantable Pulse Generator Implant Date: 20190121
Lead Channel Impedance Value: 460 Ohm
Lead Channel Setting Pacing Amplitude: 2.5 V
Lead Channel Setting Pacing Pulse Width: 0.4 ms
Lead Channel Setting Sensing Sensitivity: 0.5 mV
Pulse Gen Serial Number: 241142
Zone Setting Status: 755011

## 2023-01-24 NOTE — Progress Notes (Signed)
Remote ICD transmission.   

## 2023-03-11 ENCOUNTER — Other Ambulatory Visit: Payer: Self-pay | Admitting: Internal Medicine

## 2023-03-11 MED ORDER — PANTOPRAZOLE SODIUM 40 MG PO TBEC: 40.0000 mg | DELAYED_RELEASE_TABLET | Freq: Every day | ORAL | 11 refills | Status: DC

## 2023-03-11 MED ORDER — CARVEDILOL 25 MG PO TABS
ORAL_TABLET | ORAL | 3 refills | Status: DC
Start: 2023-03-11 — End: 2023-10-01

## 2023-04-12 ENCOUNTER — Ambulatory Visit (INDEPENDENT_AMBULATORY_CARE_PROVIDER_SITE_OTHER): Payer: BC Managed Care – PPO

## 2023-04-12 DIAGNOSIS — I428 Other cardiomyopathies: Secondary | ICD-10-CM | POA: Diagnosis not present

## 2023-04-13 LAB — CUP PACEART REMOTE DEVICE CHECK
Battery Remaining Longevity: 120 mo
Battery Remaining Percentage: 85 %
Brady Statistic RV Percent Paced: 0 %
Date Time Interrogation Session: 20240927153300
HighPow Impedance: 69 Ohm
Implantable Lead Connection Status: 753985
Implantable Lead Implant Date: 20190121
Implantable Lead Location: 753860
Implantable Lead Model: 293
Implantable Lead Serial Number: 435292
Implantable Pulse Generator Implant Date: 20190121
Lead Channel Impedance Value: 430 Ohm
Lead Channel Setting Pacing Amplitude: 2.5 V
Lead Channel Setting Pacing Pulse Width: 0.4 ms
Lead Channel Setting Sensing Sensitivity: 0.5 mV
Pulse Gen Serial Number: 241142
Zone Setting Status: 755011

## 2023-04-16 ENCOUNTER — Other Ambulatory Visit: Payer: Self-pay

## 2023-04-16 MED ORDER — ISOSORBIDE MONONITRATE ER 60 MG PO TB24: 60.0000 mg | ORAL_TABLET | Freq: Every day | ORAL | 0 refills | Status: DC

## 2023-04-18 NOTE — Progress Notes (Signed)
Remote ICD transmission.   

## 2023-05-11 ENCOUNTER — Other Ambulatory Visit: Payer: Self-pay | Admitting: Cardiology

## 2023-05-28 ENCOUNTER — Telehealth: Payer: Self-pay | Admitting: Cardiology

## 2023-05-28 MED ORDER — ISOSORBIDE MONONITRATE ER 60 MG PO TB24: 60.0000 mg | ORAL_TABLET | Freq: Every day | ORAL | 0 refills | Status: DC

## 2023-05-28 NOTE — Telephone Encounter (Signed)
Pt's medication was sent to pt's pharmacy as requested. Confirmation received.  °

## 2023-05-28 NOTE — Telephone Encounter (Signed)
*  STAT* If patient is at the pharmacy, call can be transferred to refill team.   1. Which medications need to be refilled? (please list name of each medication and dose if known)   isosorbide mononitrate (IMDUR) 60 MG 24 hr tablet     4. Which pharmacy/location (including street and city if local pharmacy) is medication to be sent to? [id number] - [place], [place] - [address]     5. Do they need a 30 day or 90 day supply? 90    Pt scheduled for f/u 07/31/22

## 2023-05-31 ENCOUNTER — Telehealth: Payer: Self-pay | Admitting: Cardiology

## 2023-05-31 MED ORDER — FAMOTIDINE 20 MG PO TABS: 20.0000 mg | ORAL_TABLET | Freq: Two times a day (BID) | ORAL | 3 refills | Status: DC

## 2023-05-31 MED ORDER — ATORVASTATIN CALCIUM 20 MG PO TABS: ORAL_TABLET | ORAL | 0 refills | Status: DC

## 2023-05-31 NOTE — Telephone Encounter (Signed)
 RX sent to requested Pharmacy

## 2023-05-31 NOTE — Telephone Encounter (Signed)
*  STAT* If patient is at the pharmacy, call can be transferred to refill team.   1. Which medications need to be refilled? (please list name of each medication and dose if known) Atorvastatin,  and Famotidine   2. Would you like to learn more about the convenience, safety, & potential cost savings by using the [facility]?     3. Are you open to using the [facility] (Type [facility].  4. Which pharmacy/location (including street and city if local pharmacy) is medication to be sent to?[facility] [place], [place]   5. Do they need a 30 day or 90 day supply? 90 days and refills for Atorvastatin and #18 and refills for the Famotidine

## 2023-06-12 NOTE — Progress Notes (Signed)
This encounter was created in error - please disregard.

## 2023-07-11 ENCOUNTER — Telehealth: Payer: Self-pay | Admitting: Cardiology

## 2023-07-11 MED ORDER — SPIRONOLACTONE 25 MG PO TABS: 12.5000 mg | ORAL_TABLET | Freq: Every day | ORAL | 0 refills | Status: DC

## 2023-07-11 NOTE — Telephone Encounter (Signed)
Pt's medication was sent to pt's pharmacy as requested. Confirmation received.  °

## 2023-07-11 NOTE — Telephone Encounter (Signed)
*  STAT* If patient is at the pharmacy, call can be transferred to refill team.   1. Which medications need to be refilled? (please list name of each medication and dose if known) spironolactone (ALDACTONE) 25 MG tablet    Take 0.5 tablets (12.5 mg total) by mouth daily.   2. Would you like to learn more about the convenience, safety, & potential cost savings by using the [facility]? No   3. Are you open to using the [facility]  No   4. Which pharmacy/location (including street and city if local pharmacy) is medication to be sent to?[facility] [address] [place] - [address]    5. Do they need a 30 day or 90 day supply? 90 Day Supply

## 2023-07-12 ENCOUNTER — Ambulatory Visit: Payer: BC Managed Care – PPO

## 2023-07-12 DIAGNOSIS — I428 Other cardiomyopathies: Secondary | ICD-10-CM

## 2023-07-13 LAB — CUP PACEART REMOTE DEVICE CHECK
Battery Remaining Longevity: 114 mo
Battery Remaining Percentage: 80 %
Brady Statistic RV Percent Paced: 0 %
Date Time Interrogation Session: 20241227020700
HighPow Impedance: 63 Ohm
Implantable Lead Connection Status: 753985
Implantable Lead Implant Date: 20190121
Implantable Lead Location: 753860
Implantable Lead Model: 293
Implantable Lead Serial Number: 435292
Implantable Pulse Generator Implant Date: 20190121
Lead Channel Impedance Value: 436 Ohm
Lead Channel Setting Pacing Amplitude: 2.5 V
Lead Channel Setting Pacing Pulse Width: 0.4 ms
Lead Channel Setting Sensing Sensitivity: 0.5 mV
Pulse Gen Serial Number: 241142
Zone Setting Status: 755011

## 2023-08-01 ENCOUNTER — Ambulatory Visit: Payer: BC Managed Care – PPO | Admitting: Physician Assistant

## 2023-08-15 NOTE — Addendum Note (Signed)
Addended by: Elease Etienne A on: 08/15/2023 04:22 PM   Modules accepted: Orders

## 2023-08-19 ENCOUNTER — Telehealth: Payer: Self-pay | Admitting: Cardiology

## 2023-08-19 MED ORDER — ISOSORBIDE MONONITRATE ER 60 MG PO TB24: 60.0000 mg | ORAL_TABLET | Freq: Every day | ORAL | 0 refills | Status: DC

## 2023-08-19 NOTE — Telephone Encounter (Signed)
*  STAT* If patient is at the pharmacy, call can be transferred to refill team.   1. Which medications need to be refilled? (please list name of each medication and dose if known)   isosorbide mononitrate (IMDUR) 60 MG 24 hr tablet     2. Would you like to learn more about the convenience, safety, & potential cost savings by using the [facility]? No   3. Are you open to using the [facility] ([facility] No   4. Which pharmacy/location (including street and city if local pharmacy) is medication to be sent to? [facility] [address], [place] - [address]     5. Do they need a 30 day or 90 day supply? 90 day   Pt has scheduled appt on 3/18

## 2023-08-20 ENCOUNTER — Telehealth: Payer: Self-pay | Admitting: Cardiology

## 2023-08-20 NOTE — Telephone Encounter (Signed)
Received Richard Cochran FMLA forms for patient's FMLA.  I spoke with the patient who said that he will be in to see Richard Cochran in March and will pay the $29 fee and sign ROI then. Form in Sk[place]'s box.

## 2023-08-22 NOTE — Telephone Encounter (Signed)
 FMLA paperwork received from Dr Renna Cary inbasket.  Will have him to complete it next week when he is back in the office.

## 2023-08-25 ENCOUNTER — Telehealth: Payer: Self-pay | Admitting: Cardiology

## 2023-08-27 ENCOUNTER — Encounter: Payer: Self-pay | Admitting: Cardiology

## 2023-08-27 ENCOUNTER — Ambulatory Visit: Payer: BC Managed Care – PPO | Admitting: Cardiology

## 2023-08-27 MED ORDER — ISOSORBIDE MONONITRATE ER 60 MG PO TB24: 60.0000 mg | ORAL_TABLET | Freq: Every day | ORAL | 1 refills | Status: DC

## 2023-08-27 NOTE — Telephone Encounter (Signed)
Pt's medication was resent to pt's pharmacy as requested. Confirmation received.

## 2023-08-27 NOTE — Addendum Note (Signed)
Addended by: Margaret Pyle D on: 08/27/2023 11:02 AM   Modules accepted: Orders

## 2023-08-27 NOTE — Telephone Encounter (Signed)
Patient is following up. He states he spoke with someone at [facility] and they informed him they have not received a prescription for Isosorbide. Patient states he has 3 tablets remaining and would like to have the prescription sent in again if possible. Please contact patient to confirm.

## 2023-08-27 NOTE — Telephone Encounter (Signed)
Error

## 2023-08-30 ENCOUNTER — Other Ambulatory Visit: Payer: Self-pay | Admitting: Cardiology

## 2023-09-11 ENCOUNTER — Other Ambulatory Visit: Payer: Self-pay | Admitting: Cardiology

## 2023-09-11 DIAGNOSIS — I480 Paroxysmal atrial fibrillation: Secondary | ICD-10-CM

## 2023-09-11 MED ORDER — APIXABAN 5 MG PO TABS: 5.0000 mg | ORAL_TABLET | Freq: Two times a day (BID) | ORAL | 0 refills | Status: DC

## 2023-09-11 NOTE — Telephone Encounter (Addendum)
 Eliquis 5mg  refill request received. Patient is 38 years old, weight-144.7kg, Crea-1.37 on 05/03/23 via [employer] tab, Diagnosis-Afib, and last seen by Dr. [name] on 01/26/22-needs appt, has a pending appt with Tessa on 10/01/23. Dose is appropriate based on dosing criteria.   30 DAY SUPPLY SENT UNTIL APPT.

## 2023-09-12 ENCOUNTER — Telehealth: Payer: Self-pay | Admitting: Cardiology

## 2023-09-12 NOTE — Telephone Encounter (Addendum)
 Spoke with patient and he states his entresto has been sent to the wrong pharmacy. He gets his entresto through the company.   He is not sure of the name but states he was getting it for free. Last Rx was from 2023 and it was coming from cover my meds.  It looks like he has not had a visit in over a year he has an appointment with Julian Hy. He would like patient assistance for his entresto.

## 2023-09-12 NOTE — Telephone Encounter (Signed)
 Patient calling the office for samples of medication:   1.  What medication and dosage are you requesting samples for? apixaban (ELIQUIS) 5 MG TABS tablet   2.  Are you currently out of this medication? Yes   Patient needs assistance in getting medication due to price

## 2023-09-13 ENCOUNTER — Telehealth: Payer: Self-pay

## 2023-09-13 ENCOUNTER — Other Ambulatory Visit (HOSPITAL_COMMUNITY): Payer: Self-pay

## 2023-09-13 NOTE — Telephone Encounter (Signed)
 Per eligibility search in [place], patient has two [employer]. Commercial patients do not qualify for assistance through [employer] (only Medicare and uninsured). Patient will have to use a copay card [email or web address] [email or web address] to call patient but no answer and no voicemail box to leave a message. Sending info to him via mychart.

## 2023-09-13 NOTE — Telephone Encounter (Signed)
 Per eligibility search in [place], patient has two [employer]. Commercial patients do not qualify for assistance through [employer]. Patient will have to use a copay card [email or web address].

## 2023-09-16 ENCOUNTER — Encounter: Payer: Self-pay | Admitting: Internal Medicine

## 2023-09-16 NOTE — Telephone Encounter (Signed)
Error

## 2023-09-30 NOTE — Progress Notes (Unsigned)
 Cardiology Office Note:  .   Date:  10/01/2023  ID:  Richard Cochran, DOB 31-Dec-1985, MRN [id number] PCP: Patient, No Pcp Per  [facility] Providers Cardiologist:  Donato Schultz, MD Sleep Medicine:  Armanda Magic, MD {  History of Present Illness: .   Richard Cochran is a 38 y.o. male with a past medical history of congestive heart failure, hypertension, and atrial fibrillation here for follow-up appointment.  He was last seen July 2023 by Dr. [name].  For that he was seen in August 2022 and reported substernal chest discomfort with exertion and at rest.  Isosorbide was increased to 60 mg daily and famotidine 20 mg twice daily was started.  Maintaining sinus rhythm and tolerating apixaban 5 mg twice daily.  Obtained insurance and had symptoms consistent with sleep apnea and was recommended for home sleep study.  Referral was sent to Dr. Mayford Knife.  He is follow-up with Dr. [name] and his device has been working normally with 12 years of battery longevity.  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  Echo 03/14/2017 and 07/15/2017 showing EF once again 10 to 15%.  Likely HTN cardiomyopathy.  No shocks from LifeVest.  No CAD on cardiac catheterization back in August 2018.  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  In August 2019 he was having apneic like symptoms.  Could not afford sleep study because he does not have insurance.  Would cost $2000 out-of-pocket.  Overall, no chest pain.  Dr. [name] monitoring his ICD.  On Eliquis.  No bleeding.  August 2020 follow-up for cardiomyopathy last EF was 15%.  Fibrillator in place.  Goal-directed therapy with medications as directed.  Occasionally when bending down and getting up quickly he may have some transient dizziness.  No syncope.  Tolerating medications without any side effects.  January 2022 here for follow-up.  Doing well.  NYHA class I-II  symptoms.  No defibrillation's.  Feels great.  He was last seen he wished to discuss FMLA paperwork regarding flareups and waking up feeling tired or with heartburn.  They have been working on weight loss.  Has been successful and has lost some weight.  Generally does well moving around with ambulation.  Breathing is stable.  Working 5 to 6 days out of the week.  Unfortunately suffering from nocturnal heartburn which she attributes to CPAP.  Initially while sleeping developed dry throat and subsequent heartburn so stopped using CPAP.  He is also sleeping with his torso propped up to help improve his symptoms.  Pepcid once a day in the morning.  No chest pain or palpitations.  No shortness of breath or edema.  No lightheadedness, headaches, syncope, orthopnea, or PND.  Today, he presents with a history of heart disease and acid reflux,  for assistance with filling out FMLA paperwork. He requires intermittent leave to accommodate his medical appointments and occasional days when he is unable to work due to his condition. The patient works in a physically demanding job and sometimes struggles with the demands of the job due to his medical condition. He is on medication for his heart condition and acid reflux. The patient reports that he has been out of one of his heart medications for two days and experienced chest pain during this time. He also reports that he struggles with acid reflux and tries to avoid foods that trigger it. The patient is also on a CPAP  machine for sleep but reports not using it as it causes dry mouth and acid reflux.   Reports no shortness of breath nor dyspnea on exertion. Reports no chest pain, pressure, or tightness. No edema, orthopnea, PND. Reports no palpitations.   Discussed the use of AI scribe software for clinical note transcription with the patient, who gave verbal consent to proceed.  ROS: pertinent ROS in HPI   Studies Reviewed: Marland Kitchen   EKG  Interpretation Date/Time:  Tuesday October 01 2023 10:02:00 EDT Ventricular Rate:  71 PR Interval:  198 QRS Duration:  118 QT Interval:  374 QTC Calculation: 406 R Axis:   4  Text Interpretation: Normal sinus rhythm Non-specific intra-ventricular conduction delay When compared with ECG of 06-Aug-2017 06:18, Sinus rhythm has replaced Atrial fibrillation Confirmed by Jari Favre [phone]) on 10/01/2023 10:27:17 AM    ICD Implant [facility])  08/05/2017: CONCLUSIONS:   1. Non-ischemic cardiomyopathy with chronic New York Heart Association class III heart failure.   2. Successful ICD implantation.   3.  No early apparent complications.    Echocardiogram  07/15/2017: Study Conclusions   - Left ventricle: The cavity size was severely dilated. There was    moderate concentric hypertrophy. Systolic function was severely    reduced. The estimated ejection fraction was in the range of 10%    to 15%. Diffuse hypokinesis. Features are consistent with a    pseudonormal left ventricular filling pattern, with concomitant    abnormal relaxation and increased filling pressure (grade 2    diastolic dysfunction). Doppler parameters are consistent with    high ventricular filling pressure.  - Aortic valve: Transvalvular velocity was within the normal range.    There was no stenosis. There was no regurgitation.  - Mitral valve: Transvalvular velocity was within the normal range.    There was no evidence for stenosis. There was no regurgitation.  - Right ventricle: The cavity size was normal. Wall thickness was    normal. Systolic function was normal.  - Tricuspid valve: There was no regurgitation.    Left Heart Cath  03/14/2017: There is severe left ventricular systolic dysfunction. LV end diastolic pressure is moderately elevated. The left ventricular ejection fraction is less than 25% by visual estimate.   1. Normal coronary anatomy 2. Marked LV enlargement with severe global hypokinesis and  overall EF 15-20%. 3. Moderately elevated LVEDP   Plan: aggressive therapy for CHF.   Risk Assessment/Calculations:        STOP-Bang Score:         Physical Exam:   VS:  BP 138/86   Pulse 73   Ht 5\' 11"  (1.803 m)   Wt (!) 331 lb 6.4 oz (150.3 kg)   SpO2 94%   BMI 46.22 kg/m    Wt Readings from Last 3 Encounters:  10/01/23 (!) 331 lb 6.4 oz (150.3 kg)  03/22/22 (!) 319 lb (144.7 kg)  01/26/22 (!) 321 lb (145.6 kg)    GEN: Well nourished, well developed in no acute distress NECK: No JVD; No carotid bruits CARDIAC: RRR, no murmurs, rubs, gallops RESPIRATORY:  Clear to auscultation without rales, wheezing or rhonchi  ABDOMEN: Soft, non-tender, non-distended EXTREMITIES:  No edema; No deformity   ASSESSMENT AND PLAN: .   Heart failure with reduced ejection fraction (HFrEF) Chronic HFrEF managed with Entresto and spironolactone, showing improvement. Emphasized medication adherence due to missed doses causing transient chest pain. Echocardiogram needed to assess current cardiac function. - Order echocardiogram to assess current cardiac  function. - Ensure all medications are on a 90-day refill schedule. - Continue Entresto and spironolactone.  Atrial fibrillation (AFib) AFib managed with Eliquis for stroke prevention. No recent episodes or symptoms. - Continue Eliquis.  Gastroesophageal reflux disease (GERD) Chronic GERD managed with Protonix, Pepcid, and antacids. Symptoms include xerostomia and acid reflux, especially with CPAP use. - Continue Protonix and Pepcid. - Advise dietary modifications to avoid trigger foods and eat earlier in the evening.  Follow-up Requires follow-up for heart condition management and medication refills. Coordination with workplace for FMLA paperwork due to job demands. - Complete and fax FMLA paperwork to workplace. - Schedule follow-up appointment with cardiologist. - Ensure scheduled appointment with Doctor [name].      Dispo: He can  follow-up in 6 months with Dr. [name]  Signed, Sharlene Dory, PA-C

## 2023-09-30 NOTE — Telephone Encounter (Signed)
 FMLA forms have been taking to Margaretha Glassing, [place] who is scheduled to see the patient 10/01/23.

## 2023-10-01 ENCOUNTER — Ambulatory Visit: Payer: BC Managed Care – PPO | Attending: Physician Assistant | Admitting: Physician Assistant

## 2023-10-01 ENCOUNTER — Other Ambulatory Visit: Payer: Self-pay

## 2023-10-01 ENCOUNTER — Telehealth: Payer: Self-pay | Admitting: Cardiology

## 2023-10-01 ENCOUNTER — Encounter: Payer: Self-pay | Admitting: Physician Assistant

## 2023-10-01 VITALS — BP 138/86 | HR 73 | Ht 71.0 in | Wt 331.4 lb

## 2023-10-01 DIAGNOSIS — I428 Other cardiomyopathies: Secondary | ICD-10-CM | POA: Diagnosis not present

## 2023-10-01 DIAGNOSIS — I480 Paroxysmal atrial fibrillation: Secondary | ICD-10-CM

## 2023-10-01 DIAGNOSIS — I5020 Unspecified systolic (congestive) heart failure: Secondary | ICD-10-CM

## 2023-10-01 DIAGNOSIS — I5022 Chronic systolic (congestive) heart failure: Secondary | ICD-10-CM | POA: Diagnosis not present

## 2023-10-01 DIAGNOSIS — G4733 Obstructive sleep apnea (adult) (pediatric): Secondary | ICD-10-CM

## 2023-10-01 DIAGNOSIS — I10 Essential (primary) hypertension: Secondary | ICD-10-CM

## 2023-10-01 DIAGNOSIS — Z0279 Encounter for issue of other medical certificate: Secondary | ICD-10-CM

## 2023-10-01 MED ORDER — APIXABAN 5 MG PO TABS: 5.0000 mg | ORAL_TABLET | Freq: Two times a day (BID) | ORAL | 3 refills | Status: AC

## 2023-10-01 MED ORDER — ISOSORBIDE MONONITRATE ER 60 MG PO TB24: 60.0000 mg | ORAL_TABLET | Freq: Every day | ORAL | 3 refills | Status: DC

## 2023-10-01 MED ORDER — SPIRONOLACTONE 25 MG PO TABS: 12.5000 mg | ORAL_TABLET | Freq: Every day | ORAL | 3 refills | Status: DC

## 2023-10-01 MED ORDER — ATORVASTATIN CALCIUM 20 MG PO TABS: ORAL_TABLET | ORAL | 3 refills | Status: DC

## 2023-10-01 MED ORDER — SACUBITRIL-VALSARTAN 97-103 MG PO TABS: 1.0000 | ORAL_TABLET | Freq: Two times a day (BID) | ORAL | 3 refills | Status: DC

## 2023-10-01 MED ORDER — SACUBITRIL-VALSARTAN 97-103 MG PO TABS: 1.0000 | ORAL_TABLET | Freq: Two times a day (BID) | ORAL | 3 refills | Status: AC

## 2023-10-01 MED ORDER — CARVEDILOL 25 MG PO TABS: ORAL_TABLET | ORAL | 3 refills | Status: DC

## 2023-10-01 MED ORDER — APIXABAN 5 MG PO TABS: 5.0000 mg | ORAL_TABLET | Freq: Two times a day (BID) | ORAL | 3 refills | Status: DC

## 2023-10-01 MED ORDER — PANTOPRAZOLE SODIUM 40 MG PO TBEC: 40.0000 mg | DELAYED_RELEASE_TABLET | Freq: Every day | ORAL | 3 refills | Status: AC

## 2023-10-01 NOTE — Patient Instructions (Signed)
 Medication Instructions:  Refilled medications *If you need a refill on your cardiac medications before your next appointment, please call your pharmacy*   Lab Work: NONE If you have labs (blood work) drawn today and your tests are completely normal, you will receive your results only by: MyChart Message (if you have MyChart) OR A paper copy in the mail If you have any lab test that is abnormal or we need to change your treatment, we will call you to review the results.   Testing/Procedures: Echocardiogram Your physician has requested that you have an echocardiogram. Echocardiography is a painless test that uses sound waves to create images of your heart. It provides your doctor with information about the size and shape of your heart and how well your heart's chambers and valves are working. This procedure takes approximately one hour. There are no restrictions for this procedure. Please do NOT wear cologne, perfume, aftershave, or lotions (deodorant is allowed). Please arrive 15 minutes prior to your appointment time.  Please note: We ask at that you not bring children with you during ultrasound (echo/ vascular) testing. Due to room size and safety concerns, children are not allowed in the ultrasound rooms during exams. Our front office staff cannot provide observation of children in our lobby area while testing is being conducted. An adult accompanying a patient to their appointment will only be allowed in the ultrasound room at the discretion of the ultrasound technician under special circumstances. We apologize for any inconvenience.   Follow-Up: At [facility], you and your health needs are our priority.  As part of our continuing mission to provide you with exceptional heart care, we have created designated Provider Care Teams.  These Care Teams include your primary Cardiologist (physician) and Advanced Practice Providers (APPs -  Physician Assistants and Nurse Practitioners)  who all work together to provide you with the care you need, when you need it.  We recommend signing up for the patient portal called "MyChart".  Sign up information is provided on this After Visit Summary.  MyChart is used to connect with patients for Virtual Visits (Telemedicine).  Patients are able to view lab/test results, encounter notes, upcoming appointments, etc.  Non-urgent messages can be sent to your provider as well.   To learn more about what you can do with MyChart, go to [email or web address].    Your next appointment:   6 month(s)  Provider:   [name], MD  Other Instructions   1st Floor: - Lobby - Registration  - Pharmacy  - Lab - Cafe  2nd Floor: - PV Lab - Diagnostic Testing (echo, CT, nuclear med)  3rd Floor: - Vacant  4th Floor: - TCTS (cardiothoracic surgery) - AFib Clinic - Structural Heart Clinic - Vascular Surgery  - Vascular Ultrasound  5th Floor: - HeartCare Cardiology (general and EP) - Clinical Pharmacy for coumadin, hypertension, lipid, weight-loss medications, and med management appointments    Valet parking services will be available as well.

## 2023-10-01 NOTE — Telephone Encounter (Signed)
 Paper Work Dropped Off: [employer] Assistance Paperwork   Date: 10/01/2023  Location of paper:  Dr. Mayford Knife MailBox

## 2023-10-01 NOTE — Telephone Encounter (Signed)
 Received [employer] Patient Assistance forms filled out by patient and placed in Turner's box. Printed a copy of pt's insurance card to add to the paper work. Pt was called about his W2 that needs to be turned in with forms, however pt said he spoke with someone at [employer] who said he did not need his W2 at this time. Pt stated that the lady he spoke with at [employer] would email him with something to sign once his paper work has been received. Forms and insurance information were faxed to [facility] Patient Assistance.

## 2023-10-02 ENCOUNTER — Telehealth: Payer: Self-pay | Admitting: Pharmacy Technician

## 2023-10-02 ENCOUNTER — Other Ambulatory Visit (HOSPITAL_COMMUNITY): Payer: Self-pay

## 2023-10-02 NOTE — Telephone Encounter (Signed)
 PAP: Application for Richard Cochran has been submitted to [employer], via fax   To be noted: looks like patient has tricare and anthem but only [employer] id card was submitted and  Pt was called about his W2 that needs to be turned in with forms, however pt said he spoke with someone at [employer] who said he did not need his W2 at this time. Pt stated that the lady he spoke with at [employer] would email him with something to sign once his paper work has been received.

## 2023-10-02 NOTE — Telephone Encounter (Signed)
 Completed Irven Coe form faxed to insurance and scanned into chart.  Billing notified.

## 2023-10-03 ENCOUNTER — Other Ambulatory Visit (HOSPITAL_COMMUNITY): Payer: Self-pay

## 2023-10-04 ENCOUNTER — Other Ambulatory Visit (HOSPITAL_COMMUNITY): Payer: Self-pay

## 2023-10-04 ENCOUNTER — Telehealth: Payer: Self-pay

## 2023-10-04 NOTE — Telephone Encounter (Signed)
 Pharmacy Patient Advocate Encounter  Received notification from  [facility]  that Prior Authorization for ENTRESTO has been APPROVED from 10/03/23 to 10/02/24. Ran test claim, Copay is $0. This test claim was processed through [facility]- copay amounts may vary at other pharmacies due to pharmacy/plan contracts, or as the patient moves through the different stages of their insurance plan.

## 2023-10-04 NOTE — Telephone Encounter (Signed)
 Pharmacy Patient Advocate Encounter   Received notification from CoverMyMeds that prior authorization for ENTRESTO is required/requested.   Insurance verification completed.   The patient is insured through  [employer]  .   Per test claim: PA required; PA submitted to above mentioned insurance via CoverMyMeds Key/confirmation #/EOC [id number] Status is pending

## 2023-10-09 NOTE — Telephone Encounter (Signed)
 Pharmacy Patient Advocate Encounter   Received notification from  [facility]  that Prior Authorization for ENTRESTO has been APPROVED from 10/03/23 to 10/02/24. Ran test claim, Copay is $0.

## 2023-10-11 ENCOUNTER — Ambulatory Visit (INDEPENDENT_AMBULATORY_CARE_PROVIDER_SITE_OTHER): Payer: BC Managed Care – PPO

## 2023-10-11 DIAGNOSIS — I428 Other cardiomyopathies: Secondary | ICD-10-CM

## 2023-10-11 LAB — CUP PACEART REMOTE DEVICE CHECK
Battery Remaining Longevity: 114 mo
Battery Remaining Percentage: 77 %
Brady Statistic RV Percent Paced: 0 %
Date Time Interrogation Session: 20250328014200
HighPow Impedance: 67 Ohm
Implantable Lead Connection Status: 753985
Implantable Lead Implant Date: 20190121
Implantable Lead Location: 753860
Implantable Lead Model: 293
Implantable Lead Serial Number: 435292
Implantable Pulse Generator Implant Date: 20190121
Lead Channel Impedance Value: 436 Ohm
Lead Channel Setting Pacing Amplitude: 2.5 V
Lead Channel Setting Pacing Pulse Width: 0.4 ms
Lead Channel Setting Sensing Sensitivity: 0.5 mV
Pulse Gen Serial Number: 241142
Zone Setting Status: 755011

## 2023-10-17 NOTE — Telephone Encounter (Signed)
 Pt called in stating his insurance told him they won't cover this med. Please advise.

## 2023-10-17 NOTE — Telephone Encounter (Signed)
 Spoke with patient and he states his entresto is [phone] and he can not afford that. I informed patient it states he should have a $0 copay from the prior auth. He states pharmacy is saying [phone]  I called pharmacy and they did not have his insurance information.  Spoke with patient and he is aware his pharmacy needs hsi insurance information. He states he will provide them with the information

## 2023-10-18 ENCOUNTER — Encounter: Payer: Self-pay | Admitting: Internal Medicine

## 2023-10-29 ENCOUNTER — Other Ambulatory Visit (HOSPITAL_COMMUNITY)

## 2023-11-19 NOTE — Progress Notes (Signed)
 Remote ICD transmission.

## 2023-11-28 ENCOUNTER — Telehealth (HOSPITAL_COMMUNITY): Payer: Self-pay | Admitting: Physician Assistant

## 2023-11-28 NOTE — Telephone Encounter (Signed)
 Patient called and cancelled echocardiogram for the reason below:  11/28/2023 10:35 AM By: Dalene Duck A  Cancel Rsn: Patient (unable to make appt will c/b to r/s)   Order will be removed from the active echo WQ and when patient calls back we will reinstate the order. Thank you.

## 2023-12-04 ENCOUNTER — Ambulatory Visit (HOSPITAL_COMMUNITY)

## 2024-01-10 ENCOUNTER — Ambulatory Visit: Payer: BC Managed Care – PPO

## 2024-01-10 DIAGNOSIS — I428 Other cardiomyopathies: Secondary | ICD-10-CM

## 2024-01-13 ENCOUNTER — Ambulatory Visit: Payer: Self-pay | Admitting: Internal Medicine

## 2024-01-13 LAB — CUP PACEART REMOTE DEVICE CHECK
Battery Remaining Longevity: 114 mo
Battery Remaining Percentage: 78 %
Brady Statistic RV Percent Paced: 0 %
Date Time Interrogation Session: 20250627093300
HighPow Impedance: 66 Ohm
Implantable Lead Connection Status: 753985
Implantable Lead Implant Date: 20190121
Implantable Lead Location: 753860
Implantable Lead Model: 293
Implantable Lead Serial Number: 435292
Implantable Pulse Generator Implant Date: 20190121
Lead Channel Impedance Value: 448 Ohm
Lead Channel Setting Pacing Amplitude: 2.5 V
Lead Channel Setting Pacing Pulse Width: 0.4 ms
Lead Channel Setting Sensing Sensitivity: 0.5 mV
Pulse Gen Serial Number: 241142
Zone Setting Status: 755011

## 2024-01-23 ENCOUNTER — Encounter: Payer: Self-pay | Admitting: Cardiology

## 2024-01-23 ENCOUNTER — Encounter: Payer: Self-pay | Admitting: Physician Assistant

## 2024-01-29 ENCOUNTER — Telehealth: Payer: Self-pay | Admitting: Cardiology

## 2024-01-29 NOTE — Telephone Encounter (Signed)
 Received [employer] form.  I sent the release of information and billing sheet to the patient.  I instructed him to sign everything and send back to me with a picture of a personal check made out for $29.  He agreed to do this.

## 2024-01-31 NOTE — Telephone Encounter (Signed)
 I spoke with Mr. Wender today.  He will be sending the signed paper work back to me via email and will mail a money order. We will send the completed forms to [facility] when I receive the money order.

## 2024-02-04 NOTE — Telephone Encounter (Signed)
 Tessa to complete and sign:  Lucien Orren LOISE DEVONNA      01/27/24  9:43 AM Can we print this and I will complete. Then, we can fax to his work place.    Thanks! Orren LOISE Lucien, PA-C

## 2024-02-06 NOTE — Telephone Encounter (Signed)
 Paperwork to be completed has been placed in [employer] to be completed and signed.

## 2024-02-06 NOTE — Telephone Encounter (Signed)
 I received this patient's signed release of information today.  He mailed a money order for the $29 form fee to my attention.  We cannot send to insurance before the payment gets here.  [place] form is in Dr. Jeffrie' box.

## 2024-02-10 NOTE — Telephone Encounter (Signed)
 Jonel is out on pal this week, is there someone else that can handle this request as pt needs this paperwork by 02/12/24. He asked if his money order was received.

## 2024-02-10 NOTE — Telephone Encounter (Signed)
 Pts FMLA forms were completed and signed by Orren Fabry PA-C.   Will send this message to Jonel Bucco with our front office team, to make her aware of completion and for further management/follow-up with the pt.

## 2024-02-11 DIAGNOSIS — Z0279 Encounter for issue of other medical certificate: Secondary | ICD-10-CM

## 2024-02-11 NOTE — Telephone Encounter (Signed)
 Forms and payment were received 02/10/24. Completed forms sent to [employer].

## 2024-02-21 ENCOUNTER — Encounter: Payer: Self-pay | Admitting: Physician Assistant

## 2024-02-21 DIAGNOSIS — I5022 Chronic systolic (congestive) heart failure: Secondary | ICD-10-CM

## 2024-02-21 NOTE — Telephone Encounter (Signed)
 See other MyChart message from 02/21/2024.

## 2024-02-25 ENCOUNTER — Other Ambulatory Visit: Payer: Self-pay

## 2024-03-06 ENCOUNTER — Telehealth: Payer: Self-pay | Admitting: Cardiology

## 2024-03-06 NOTE — Telephone Encounter (Signed)
  Pt c/o medication issue:  1. Name of Medication:   apixaban  (ELIQUIS ) 5 MG TABS tablet    2. How are you currently taking this medication (dosage and times per day)?   Take 1 tablet (5 mg total) by mouth 2 (two) times daily.    3. Are you having a reaction (difficulty breathing--STAT)? No   4. What is your medication issue? Richard Cochran - [employer] calling to follow up their fax request for medication change for thi medication. She said it was faxed over 08/20 and will refax today

## 2024-03-13 NOTE — Addendum Note (Signed)
 Addended by: VICCI SELLER A on: 03/13/2024 03:30 PM   Modules accepted: Orders

## 2024-03-13 NOTE — Progress Notes (Signed)
 Remote ICD transmission.

## 2024-04-10 ENCOUNTER — Ambulatory Visit: Payer: BC Managed Care – PPO

## 2024-04-10 DIAGNOSIS — I428 Other cardiomyopathies: Secondary | ICD-10-CM

## 2024-04-11 LAB — CUP PACEART REMOTE DEVICE CHECK
Battery Remaining Longevity: 102 mo
Battery Remaining Percentage: 74 %
Brady Statistic RV Percent Paced: 0 %
Date Time Interrogation Session: 20250926014100
HighPow Impedance: 71 Ohm
Implantable Lead Connection Status: 753985
Implantable Lead Implant Date: 20190121
Implantable Lead Location: 753860
Implantable Lead Model: 293
Implantable Lead Serial Number: 435292
Implantable Pulse Generator Implant Date: 20190121
Lead Channel Impedance Value: 465 Ohm
Lead Channel Setting Pacing Amplitude: 2.5 V
Lead Channel Setting Pacing Pulse Width: 0.4 ms
Lead Channel Setting Sensing Sensitivity: 0.5 mV
Pulse Gen Serial Number: 241142
Zone Setting Status: 755011

## 2024-04-12 ENCOUNTER — Ambulatory Visit: Payer: Self-pay | Admitting: Internal Medicine

## 2024-04-15 NOTE — Progress Notes (Signed)
 Remote ICD Transmission

## 2024-05-27 ENCOUNTER — Other Ambulatory Visit: Payer: Self-pay | Admitting: Cardiology

## 2024-06-08 ENCOUNTER — Ambulatory Visit (HOSPITAL_COMMUNITY)
Admission: RE | Admit: 2024-06-08 | Discharge: 2024-06-08 | Disposition: A | Discharge status: Home / Self Care | Source: Ambulatory Visit | Attending: Cardiovascular Disease | Admitting: Cardiovascular Disease

## 2024-06-08 ENCOUNTER — Encounter: Payer: Self-pay | Admitting: Internal Medicine

## 2024-06-08 ENCOUNTER — Ambulatory Visit: Attending: Cardiovascular Disease | Admitting: Internal Medicine

## 2024-06-08 DIAGNOSIS — I5022 Chronic systolic (congestive) heart failure: Secondary | ICD-10-CM | POA: Diagnosis present

## 2024-06-08 DIAGNOSIS — I480 Paroxysmal atrial fibrillation: Secondary | ICD-10-CM | POA: Diagnosis not present

## 2024-06-08 LAB — CUP PACEART INCLINIC DEVICE CHECK
Date Time Interrogation Session: 20251124120201
HighPow Impedance: 69 Ohm
Implantable Lead Connection Status: 753985
Implantable Lead Implant Date: 20190121
Implantable Lead Location: 753860
Implantable Lead Model: 293
Implantable Lead Serial Number: 435292
Implantable Pulse Generator Implant Date: 20190121
Lead Channel Impedance Value: 441 Ohm
Lead Channel Pacing Threshold Amplitude: 1.2 V
Lead Channel Pacing Threshold Pulse Width: 0.4 ms
Lead Channel Sensing Intrinsic Amplitude: 25 mV
Lead Channel Setting Pacing Amplitude: 2.5 V
Lead Channel Setting Pacing Pulse Width: 0.4 ms
Lead Channel Setting Sensing Sensitivity: 0.5 mV
Pulse Gen Serial Number: 241142
Zone Setting Status: 755011

## 2024-06-08 LAB — ECHOCARDIOGRAM COMPLETE
Area-P 1/2: 3.81 cm2
S' Lateral: 4.3 cm

## 2024-06-08 MED ORDER — SPIRONOLACTONE 25 MG PO TABS: 12.5000 mg | ORAL_TABLET | Freq: Every day | ORAL | 3 refills | Status: AC

## 2024-06-08 MED ORDER — ATORVASTATIN CALCIUM 20 MG PO TABS: ORAL_TABLET | ORAL | 3 refills | Status: AC

## 2024-06-08 MED ORDER — CARVEDILOL 25 MG PO TABS: ORAL_TABLET | ORAL | 3 refills | Status: AC

## 2024-06-08 MED ORDER — ISOSORBIDE MONONITRATE ER 60 MG PO TB24: 60.0000 mg | ORAL_TABLET | Freq: Every day | ORAL | 3 refills | Status: AC

## 2024-06-08 NOTE — Progress Notes (Signed)
 HPI [name] returns today for followup. He is a pleasant morbidly obese man with a h/o chronic systolic heart failure due to a non-ischemic CM, morbid obesity, and HTN, s/p ICD insertion. He has not lost any more weight. He denies chest pain or sob. He is working 2 jobs.   No Known Allergies   Current Outpatient Medications  Medication Sig Dispense Refill   acetaminophen  (TYLENOL ) 325 MG tablet Take 2 tablets (650 mg total) by mouth every 4 (four) hours as needed for headache or mild pain.     apixaban  (ELIQUIS ) 5 MG TABS tablet Take 1 tablet (5 mg total) by mouth 2 (two) times daily. 180 tablet 3   atorvastatin  (LIPITOR ) 20 MG tablet TAKE 1 TABLET BY MOUTH ONCE DAILY AT  6  PM 90 tablet 3   carvedilol  (COREG ) 25 MG tablet TAKE 1 & 1/2 (ONE & ONE-HALF) TABLETS BY MOUTH TWICE DAILY WITH MEALS 270 tablet 3   famotidine  (PEPCID ) 20 MG tablet Take 1 tablet by mouth twice daily 180 tablet 1   isosorbide  mononitrate (IMDUR ) 60 MG 24 hr tablet Take 1 tablet (60 mg total) by mouth daily. 90 tablet 3   pantoprazole  (PROTONIX ) 40 MG tablet Take 1 tablet (40 mg total) by mouth daily. 90 tablet 3   sacubitril -valsartan  (ENTRESTO ) 97-103 MG Take 1 tablet by mouth 2 (two) times daily. 180 tablet 3   spironolactone  (ALDACTONE ) 25 MG tablet Take 0.5 tablets (12.5 mg total) by mouth daily. 45 tablet 3   No current facility-administered medications for this visit.     Past Medical History:  Diagnosis Date   [employer] ICD (implantable cardioverter-defibrillator) in place    Childhood asthma    CKD (chronic kidney disease)    left kidney weaker than right (03/13/2017)   Hypertension    NSTEMI (non-ST elevated myocardial infarction) (HCC) 03/13/2017   thelbert 03/13/2017   PAF (paroxysmal atrial fibrillation) (HCC)    Renal insufficiency     ROS:   All systems reviewed and negative except as noted in the HPI.   Past Surgical History:  Procedure Laterality Date   ICD IMPLANT  N/A 08/05/2017   Procedure: ICD IMPLANT;  Surgeon: Waddell Richard ORN, MD;  Location: [facility] INVASIVE CV LAB;  Service: Cardiovascular;  Laterality: N/A;   LEFT HEART CATH AND CORONARY ANGIOGRAPHY N/A 03/14/2017   Procedure: LEFT HEART CATH AND CORONARY ANGIOGRAPHY;  Surgeon: Jordan, Peter M, MD;  Location: [facility] INVASIVE CV LAB;  Service: Cardiovascular;  Laterality: N/A;   NO PAST SURGERIES       Family History  Problem Relation Age of Onset   Hypertension Mother    Hypertension Father      Social History   Socioeconomic History   Marital status: Single    Spouse name: Not on file   Number of children: Not on file   Years of education: Not on file   Highest education level: Not on file  Occupational History   Not on file  Tobacco Use   Smoking status: Former    Types: Cigarettes   Smokeless tobacco: Never   Tobacco comments:    03/13/2017 quit when I was 11 or 12  Vaping Use   Vaping status: Never Used  Substance and Sexual Activity   Alcohol use: Yes    Comment: 03/13/2017 I drink on my birthday; nothing else   Drug use: No   Sexual activity: Not on file  Other Topics Concern  Not on file  Social History Narrative   Not on file   Social Drivers of Health   Financial Resource Strain: Not on file  Food Insecurity: Not on file  Transportation Needs: Not on file  Physical Activity: Not on file  Stress: Not on file  Social Connections: Not on file  Intimate Partner Violence: Not on file     BP (!) 151/90   Pulse 62   Ht 5' 11 (1.803 m)   SpO2 95%   BMI 46.22 kg/m   Physical Exam:  Well appearing NAD HEENT: Unremarkable Neck:  No JVD, no thyromegally Lymphatics:  No adenopathy Back:  No CVA tenderness Lungs:  Clear HEART:  Regular rate rhythm, no murmurs, no rubs, no clicks Abd:  soft, positive bowel sounds, no organomegally, no rebound, no guarding Ext:  2 plus pulses, no edema, no cyanosis, no clubbing Skin:  No rashes no nodules Neuro:  CN II through  XII intact, motor grossly intact   DEVICE  Normal device function.  See PaceArt for details.   Assess/Plan:  Chronic systolic heart failure - his symptoms are class 2. He will continue his current meds. ICD - his device is working normally with 8.5 years of battery longevity remaining. Obesity - we discussed the importance of weight loss. HTN - his bp is well controlled. He will continue his current meds. I encouraged him to lose weight.   Richard Jacklyn Branan,MD

## 2024-06-08 NOTE — Patient Instructions (Signed)

## 2024-06-09 ENCOUNTER — Ambulatory Visit: Payer: Self-pay | Admitting: Physician Assistant

## 2024-07-10 ENCOUNTER — Ambulatory Visit: Payer: BC Managed Care – PPO

## 2024-07-10 DIAGNOSIS — I428 Other cardiomyopathies: Secondary | ICD-10-CM | POA: Diagnosis not present

## 2024-07-12 LAB — CUP PACEART REMOTE DEVICE CHECK
Battery Remaining Longevity: 102 mo
Battery Remaining Percentage: 70 %
Brady Statistic RV Percent Paced: 0 %
Date Time Interrogation Session: 20251226015500
HighPow Impedance: 67 Ohm
Implantable Lead Connection Status: 753985
Implantable Lead Implant Date: 20190121
Implantable Lead Location: 753860
Implantable Lead Model: 293
Implantable Lead Serial Number: 435292
Implantable Pulse Generator Implant Date: 20190121
Lead Channel Impedance Value: 451 Ohm
Lead Channel Setting Pacing Amplitude: 2.5 V
Lead Channel Setting Pacing Pulse Width: 0.4 ms
Lead Channel Setting Sensing Sensitivity: 0.5 mV
Pulse Gen Serial Number: 241142
Zone Setting Status: 755011

## 2024-07-13 NOTE — Progress Notes (Signed)
 Remote ICD Transmission

## 2024-07-14 ENCOUNTER — Ambulatory Visit: Payer: Self-pay | Admitting: Internal Medicine

## 2024-10-09 ENCOUNTER — Ambulatory Visit

## 2025-01-08 ENCOUNTER — Ambulatory Visit
